# Patient Record
Sex: Male | Born: 1935 | Race: White | Hispanic: No | Marital: Married | Smoking: Former smoker
Health system: Southern US, Community
[De-identification: ages and names within clinical notes are randomized; demographics above are authoritative.]

## PROBLEM LIST (undated history)

## (undated) DIAGNOSIS — I10 Essential (primary) hypertension: Secondary | ICD-10-CM

---

## 2003-12-13 ENCOUNTER — Other Ambulatory Visit: Payer: Self-pay

## 2003-12-19 ENCOUNTER — Other Ambulatory Visit: Payer: Self-pay

## 2004-01-07 ENCOUNTER — Other Ambulatory Visit: Payer: Self-pay

## 2004-01-10 ENCOUNTER — Other Ambulatory Visit: Payer: Self-pay

## 2006-02-10 ENCOUNTER — Ambulatory Visit: Payer: Self-pay | Admitting: Gastroenterology

## 2006-02-21 ENCOUNTER — Ambulatory Visit: Payer: Self-pay | Admitting: Gastroenterology

## 2009-01-25 ENCOUNTER — Inpatient Hospital Stay: Payer: Self-pay | Admitting: Internal Medicine

## 2010-07-05 ENCOUNTER — Encounter: Payer: Self-pay | Admitting: Gastroenterology

## 2011-01-08 NOTE — Letter (Signed)
Summary: New Patient letter  [facility]  [address] [place], [place] [address]   Phone: [phone]  Fax: [phone]       07/05/2010 MRN: [id number]  KINSER FELLMAN [address] [place], [place]  [address]  Dear Mr. Cragun,  Welcome to the Gastroenterology Division at [facility].    You are scheduled to see Dr.  Russella Dar on 09-03-10 at 2:30pm on the 3rd floor at [facility], [address]  We ask that you try to arrive at our office 15 minutes prior to your appointment time to allow for check-in.  We would like you to complete the enclosed self-administered evaluation form prior to your visit and bring it with you on the day of your appointment.  We will review it with you.  Also, please bring a complete list of all your medications or, if you prefer, bring the medication bottles and we will list them.  Please bring your insurance card so that we may make a copy of it.  If your insurance requires a referral to see a specialist, please bring your referral form from your primary care physician.  Co-payments are due at the time of your visit and may be paid by cash, check or credit card.     Your office visit will consist of a consult with your physician (includes a physical exam), any laboratory testing he/she may order, scheduling of any necessary diagnostic testing (e.g. x-ray, ultrasound, CT-scan), and scheduling of a procedure (e.g. Endoscopy, Colonoscopy) if required.  Please allow enough time on your schedule to allow for any/all of these possibilities.    If you cannot keep your appointment, please call [phone] to cancel or reschedule prior to your appointment date.  This allows [place] the opportunity to schedule an appointment for another patient in need of care.  If you do not cancel or reschedule by 5 p.m. the business day prior to your appointment date, you will be charged a $50.00 late cancellation/no-show fee.    Thank you for choosing [facility] for your medical needs.  We appreciate the opportunity to care for you.  Please visit [place] at our website  to learn more about our practice.                     Sincerely,                                                             The Gastroenterology Division

## 2011-08-26 ENCOUNTER — Inpatient Hospital Stay (INDEPENDENT_AMBULATORY_CARE_PROVIDER_SITE_OTHER)
Admission: RE | Admit: 2011-08-26 | Discharge: 2011-08-26 | Disposition: A | Discharge status: Home / Self Care | Payer: Medicare Other | Source: Ambulatory Visit | Attending: Emergency Medicine | Admitting: Emergency Medicine

## 2011-08-26 ENCOUNTER — Emergency Department (HOSPITAL_COMMUNITY)
Admission: EM | Admit: 2011-08-26 | Discharge: 2011-08-26 | Disposition: A | Discharge status: Home / Self Care | Payer: Medicare Other | Attending: Emergency Medicine | Admitting: Emergency Medicine

## 2011-08-26 ENCOUNTER — Emergency Department (HOSPITAL_COMMUNITY): Payer: Medicare Other

## 2011-08-26 DIAGNOSIS — R141 Gas pain: Secondary | ICD-10-CM | POA: Insufficient documentation

## 2011-08-26 DIAGNOSIS — R142 Eructation: Secondary | ICD-10-CM | POA: Insufficient documentation

## 2011-08-26 DIAGNOSIS — I10 Essential (primary) hypertension: Secondary | ICD-10-CM | POA: Insufficient documentation

## 2011-08-26 DIAGNOSIS — K449 Diaphragmatic hernia without obstruction or gangrene: Secondary | ICD-10-CM | POA: Insufficient documentation

## 2011-08-26 DIAGNOSIS — R079 Chest pain, unspecified: Secondary | ICD-10-CM

## 2011-08-26 DIAGNOSIS — R42 Dizziness and giddiness: Secondary | ICD-10-CM | POA: Insufficient documentation

## 2011-08-26 DIAGNOSIS — R1013 Epigastric pain: Secondary | ICD-10-CM | POA: Insufficient documentation

## 2011-08-26 DIAGNOSIS — R110 Nausea: Secondary | ICD-10-CM | POA: Insufficient documentation

## 2011-08-26 LAB — DIFFERENTIAL
Lymphs Abs: 1.2 10*3/uL (ref 0.7–4.0)
Monocytes Absolute: 0.4 10*3/uL (ref 0.1–1.0)
Monocytes Relative: 5 % (ref 3–12)
Neutro Abs: 5.4 10*3/uL (ref 1.7–7.7)
Neutrophils Relative %: 75 % (ref 43–77)

## 2011-08-26 LAB — CBC
Hemoglobin: 13.5 g/dL (ref 13.0–17.0)
MCH: 30.4 pg (ref 26.0–34.0)
MCHC: 34.7 g/dL (ref 30.0–36.0)
MCV: 87.6 fL (ref 78.0–100.0)
RBC: 4.44 MIL/uL (ref 4.22–5.81)

## 2011-08-26 LAB — URINALYSIS, ROUTINE W REFLEX MICROSCOPIC
Bilirubin Urine: NEGATIVE
Glucose, UA: NEGATIVE mg/dL
Hgb urine dipstick: NEGATIVE
Protein, ur: NEGATIVE mg/dL
Specific Gravity, Urine: 1.008 (ref 1.005–1.030)

## 2011-08-26 LAB — COMPREHENSIVE METABOLIC PANEL
ALT: 23 U/L (ref 0–53)
AST: 27 U/L (ref 0–37)
Albumin: 3.8 g/dL (ref 3.5–5.2)
Alkaline Phosphatase: 74 U/L (ref 39–117)
CO2: 26 mEq/L (ref 19–32)
Chloride: 102 mEq/L (ref 96–112)
Potassium: 5.2 mEq/L — ABNORMAL HIGH (ref 3.5–5.1)
Sodium: 137 mEq/L (ref 135–145)
Total Bilirubin: 0.5 mg/dL (ref 0.3–1.2)

## 2011-08-26 LAB — POCT I-STAT TROPONIN I: Troponin i, poc: 0 ng/mL (ref 0.00–0.08)

## 2011-08-26 LAB — CK TOTAL AND CKMB (NOT AT ARMC)
CK, MB: 4.6 ng/mL — ABNORMAL HIGH (ref 0.3–4.0)
Relative Index: INVALID (ref 0.0–2.5)

## 2011-09-13 ENCOUNTER — Ambulatory Visit: Payer: Self-pay | Admitting: Internal Medicine

## 2012-09-04 ENCOUNTER — Telehealth: Payer: Self-pay | Admitting: Gastroenterology

## 2012-09-04 NOTE — Telephone Encounter (Signed)
I spoke with the patient's wife.  She reports that he had a large amount of blood in the toilet'  He filled the toilet with blood.  His wife reports that the patient is not at home and that he was going to his primary care MD's office. They will call back if they need an appt here

## 2013-05-25 ENCOUNTER — Emergency Department: Payer: Self-pay | Admitting: Emergency Medicine

## 2013-05-25 LAB — BASIC METABOLIC PANEL
Anion Gap: 9 (ref 7–16)
Calcium, Total: 8.7 mg/dL (ref 8.5–10.1)
Co2: 21 mmol/L (ref 21–32)
Creatinine: 1.76 mg/dL — ABNORMAL HIGH (ref 0.60–1.30)
Glucose: 95 mg/dL (ref 65–99)
Osmolality: 277 (ref 275–301)
Potassium: 4.7 mmol/L (ref 3.5–5.1)

## 2013-05-25 LAB — CBC
HCT: 39.4 % — ABNORMAL LOW (ref 40.0–52.0)
HGB: 13.3 g/dL (ref 13.0–18.0)
MCHC: 33.8 g/dL (ref 32.0–36.0)
Platelet: 213 10*3/uL (ref 150–440)
RBC: 4.56 10*6/uL (ref 4.40–5.90)
RDW: 14.7 % — ABNORMAL HIGH (ref 11.5–14.5)

## 2013-05-26 LAB — CK TOTAL AND CKMB (NOT AT ARMC): CK-MB: 2.5 ng/mL (ref 0.5–3.6)

## 2014-06-03 LAB — URINALYSIS, COMPLETE
BACTERIA: NONE SEEN
Bacteria: NONE SEEN
Bilirubin,UR: NEGATIVE
Bilirubin,UR: NEGATIVE
Blood: NEGATIVE
GLUCOSE, UR: NEGATIVE mg/dL (ref 0–75)
GLUCOSE, UR: NEGATIVE mg/dL (ref 0–75)
Hyaline Cast: 2
KETONE: NEGATIVE
Ketone: NEGATIVE
Leukocyte Esterase: NEGATIVE
NITRITE: NEGATIVE
NITRITE: NEGATIVE
PH: 5 (ref 4.5–8.0)
Ph: 8 (ref 4.5–8.0)
Protein: NEGATIVE
Protein: 30
RBC,UR: 1 /HPF (ref 0–5)
Specific Gravity: 1.01 (ref 1.003–1.030)
Specific Gravity: 1.019 (ref 1.003–1.030)
Squamous Epithelial: NONE SEEN
WBC UR: 1 /HPF (ref 0–5)
WBC UR: 6 /HPF (ref 0–5)

## 2014-06-03 LAB — COMPREHENSIVE METABOLIC PANEL
ALK PHOS: 209 U/L — AB
ANION GAP: 13 (ref 7–16)
AST: 1555 U/L — AB (ref 15–37)
Albumin: 2.9 g/dL — ABNORMAL LOW (ref 3.4–5.0)
BUN: 25 mg/dL — ABNORMAL HIGH (ref 7–18)
Bilirubin,Total: 2.6 mg/dL — ABNORMAL HIGH (ref 0.2–1.0)
CHLORIDE: 101 mmol/L (ref 98–107)
Calcium, Total: 8.7 mg/dL (ref 8.5–10.1)
Co2: 21 mmol/L (ref 21–32)
Creatinine: 1.67 mg/dL — ABNORMAL HIGH (ref 0.60–1.30)
EGFR (African American): 44 — ABNORMAL LOW
EGFR (Non-African Amer.): 38 — ABNORMAL LOW
GLUCOSE: 91 mg/dL (ref 65–99)
OSMOLALITY: 274 (ref 275–301)
POTASSIUM: 3.6 mmol/L (ref 3.5–5.1)
SGPT (ALT): 482 U/L — ABNORMAL HIGH (ref 12–78)
Sodium: 135 mmol/L — ABNORMAL LOW (ref 136–145)
Total Protein: 6.2 g/dL — ABNORMAL LOW (ref 6.4–8.2)

## 2014-06-03 LAB — CBC
HCT: 38.5 % — AB (ref 40.0–52.0)
HGB: 12.9 g/dL — AB (ref 13.0–18.0)
MCH: 30.5 pg (ref 26.0–34.0)
MCHC: 33.4 g/dL (ref 32.0–36.0)
MCV: 91 fL (ref 80–100)
PLATELETS: 152 10*3/uL (ref 150–440)
RBC: 4.21 10*6/uL — AB (ref 4.40–5.90)
RDW: 13.7 % (ref 11.5–14.5)
WBC: 2.5 10*3/uL — ABNORMAL LOW (ref 3.8–10.6)

## 2014-06-03 LAB — MAGNESIUM
MAGNESIUM: 0.8 mg/dL — AB
MAGNESIUM: 1.6 mg/dL — AB

## 2014-06-03 LAB — CK TOTAL AND CKMB (NOT AT ARMC)
CK, Total: 131 U/L
CK-MB: 1.4 ng/mL (ref 0.5–3.6)

## 2014-06-03 LAB — LIPASE, BLOOD: LIPASE: 735 U/L — AB (ref 73–393)

## 2014-06-03 LAB — BILIRUBIN, DIRECT: BILIRUBIN DIRECT: 1.3 mg/dL — AB (ref 0.00–0.20)

## 2014-06-03 LAB — PHOSPHORUS
PHOSPHORUS: 1.8 mg/dL — AB (ref 2.5–4.9)
Phosphorus: 3 mg/dL (ref 2.5–4.9)

## 2014-06-03 LAB — TROPONIN I

## 2014-06-03 LAB — CLOSTRIDIUM DIFFICILE(ARMC)

## 2014-06-04 ENCOUNTER — Inpatient Hospital Stay: Payer: Self-pay | Admitting: Internal Medicine

## 2014-06-04 LAB — CBC WITH DIFFERENTIAL/PLATELET
BASOS ABS: 0 10*3/uL (ref 0.0–0.1)
Basophil %: 0.2 %
EOS ABS: 0.1 10*3/uL (ref 0.0–0.7)
Eosinophil %: 1.3 %
HCT: 34.4 % — ABNORMAL LOW (ref 40.0–52.0)
HGB: 11.3 g/dL — ABNORMAL LOW (ref 13.0–18.0)
LYMPHS PCT: 5.9 %
Lymphocyte #: 0.7 10*3/uL — ABNORMAL LOW (ref 1.0–3.6)
MCH: 30.4 pg (ref 26.0–34.0)
MCHC: 33 g/dL (ref 32.0–36.0)
MCV: 92 fL (ref 80–100)
MONO ABS: 0.4 x10 3/mm (ref 0.2–1.0)
Monocyte %: 3.3 %
Neutrophil #: 9.9 10*3/uL — ABNORMAL HIGH (ref 1.4–6.5)
Neutrophil %: 89.3 %
Platelet: 126 10*3/uL — ABNORMAL LOW (ref 150–440)
RBC: 3.73 10*6/uL — AB (ref 4.40–5.90)
RDW: 13.9 % (ref 11.5–14.5)
WBC: 11 10*3/uL — ABNORMAL HIGH (ref 3.8–10.6)

## 2014-06-04 LAB — COMPREHENSIVE METABOLIC PANEL
ALBUMIN: 2.5 g/dL — AB (ref 3.4–5.0)
AST: 712 U/L — AB (ref 15–37)
Alkaline Phosphatase: 114 U/L
Anion Gap: 12 (ref 7–16)
BUN: 25 mg/dL — ABNORMAL HIGH (ref 7–18)
Bilirubin,Total: 3.1 mg/dL — ABNORMAL HIGH (ref 0.2–1.0)
CHLORIDE: 100 mmol/L (ref 98–107)
Calcium, Total: 7.5 mg/dL — ABNORMAL LOW (ref 8.5–10.1)
Co2: 21 mmol/L (ref 21–32)
Creatinine: 2.27 mg/dL — ABNORMAL HIGH (ref 0.60–1.30)
EGFR (African American): 30 — ABNORMAL LOW
EGFR (Non-African Amer.): 26 — ABNORMAL LOW
Glucose: 73 mg/dL (ref 65–99)
Osmolality: 269 (ref 275–301)
POTASSIUM: 3.8 mmol/L (ref 3.5–5.1)
SGPT (ALT): 396 U/L — ABNORMAL HIGH (ref 12–78)
Sodium: 133 mmol/L — ABNORMAL LOW (ref 136–145)
Total Protein: 5.5 g/dL — ABNORMAL LOW (ref 6.4–8.2)

## 2014-06-04 LAB — LIPID PANEL
CHOLESTEROL: 50 mg/dL (ref 0–200)
HDL Cholesterol: 33 mg/dL — ABNORMAL LOW (ref 40–60)
LDL CHOLESTEROL, CALC: 4 mg/dL (ref 0–100)
Triglycerides: 63 mg/dL (ref 0–200)
VLDL Cholesterol, Calc: 13 mg/dL (ref 5–40)

## 2014-06-04 LAB — MAGNESIUM: Magnesium: 1.4 mg/dL — ABNORMAL LOW

## 2014-06-04 LAB — TSH: THYROID STIMULATING HORM: 2.14 u[IU]/mL

## 2014-06-05 DIAGNOSIS — I059 Rheumatic mitral valve disease, unspecified: Secondary | ICD-10-CM

## 2014-06-05 LAB — COMPREHENSIVE METABOLIC PANEL
Albumin: 2.6 g/dL — ABNORMAL LOW (ref 3.4–5.0)
Alkaline Phosphatase: 133 U/L — ABNORMAL HIGH
Anion Gap: 9 (ref 7–16)
BUN: 20 mg/dL — ABNORMAL HIGH (ref 7–18)
Bilirubin,Total: 1.4 mg/dL — ABNORMAL HIGH (ref 0.2–1.0)
Calcium, Total: 8 mg/dL — ABNORMAL LOW (ref 8.5–10.1)
Chloride: 101 mmol/L (ref 98–107)
Co2: 22 mmol/L (ref 21–32)
Creatinine: 1.97 mg/dL — ABNORMAL HIGH (ref 0.60–1.30)
EGFR (African American): 36 — ABNORMAL LOW
EGFR (Non-African Amer.): 31 — ABNORMAL LOW
Glucose: 90 mg/dL (ref 65–99)
Osmolality: 267 (ref 275–301)
Potassium: 3.4 mmol/L — ABNORMAL LOW (ref 3.5–5.1)
SGOT(AST): 288 U/L — ABNORMAL HIGH (ref 15–37)
SGPT (ALT): 272 U/L — ABNORMAL HIGH (ref 12–78)
Sodium: 132 mmol/L — ABNORMAL LOW (ref 136–145)
Total Protein: 6.2 g/dL — ABNORMAL LOW (ref 6.4–8.2)

## 2014-06-05 LAB — URINE CULTURE

## 2014-06-06 LAB — STOOL CULTURE

## 2014-06-07 LAB — CBC WITH DIFFERENTIAL/PLATELET
Basophil #: 0.1 10*3/uL (ref 0.0–0.1)
Basophil %: 0.6 %
EOS ABS: 0.1 10*3/uL (ref 0.0–0.7)
Eosinophil %: 1.6 %
HCT: 37.3 % — ABNORMAL LOW (ref 40.0–52.0)
HGB: 12.3 g/dL — AB (ref 13.0–18.0)
LYMPHS PCT: 9.7 %
Lymphocyte #: 0.9 10*3/uL — ABNORMAL LOW (ref 1.0–3.6)
MCH: 29.8 pg (ref 26.0–34.0)
MCHC: 32.9 g/dL (ref 32.0–36.0)
MCV: 91 fL (ref 80–100)
Monocyte #: 1 x10 3/mm (ref 0.2–1.0)
Monocyte %: 11.3 %
NEUTROS ABS: 6.8 10*3/uL — AB (ref 1.4–6.5)
Neutrophil %: 76.8 %
Platelet: 152 10*3/uL (ref 150–440)
RBC: 4.11 10*6/uL — AB (ref 4.40–5.90)
RDW: 14.4 % (ref 11.5–14.5)
WBC: 8.9 10*3/uL (ref 3.8–10.6)

## 2014-06-07 LAB — BASIC METABOLIC PANEL
Anion Gap: 11 (ref 7–16)
BUN: 16 mg/dL (ref 7–18)
Calcium, Total: 8.4 mg/dL — ABNORMAL LOW (ref 8.5–10.1)
Chloride: 104 mmol/L (ref 98–107)
Co2: 20 mmol/L — ABNORMAL LOW (ref 21–32)
Creatinine: 1.55 mg/dL — ABNORMAL HIGH (ref 0.60–1.30)
EGFR (African American): 48 — ABNORMAL LOW
EGFR (Non-African Amer.): 41 — ABNORMAL LOW
Glucose: 100 mg/dL — ABNORMAL HIGH (ref 65–99)
Osmolality: 271 (ref 275–301)
Potassium: 3.3 mmol/L — ABNORMAL LOW (ref 3.5–5.1)
SODIUM: 135 mmol/L — AB (ref 136–145)

## 2014-06-08 LAB — CULTURE, BLOOD (SINGLE)

## 2014-06-11 LAB — CULTURE, BLOOD (SINGLE)

## 2014-08-07 ENCOUNTER — Emergency Department: Payer: Self-pay | Admitting: Emergency Medicine

## 2014-08-07 LAB — URINALYSIS, COMPLETE
BILIRUBIN, UR: NEGATIVE
Blood: NEGATIVE
Glucose,UR: NEGATIVE mg/dL (ref 0–75)
Hyaline Cast: 3
Ketone: NEGATIVE
LEUKOCYTE ESTERASE: NEGATIVE
NITRITE: NEGATIVE
PH: 5 (ref 4.5–8.0)
PROTEIN: NEGATIVE
SQUAMOUS EPITHELIAL: NONE SEEN
Specific Gravity: 1.015 (ref 1.003–1.030)

## 2014-08-07 LAB — CBC
HCT: 38.7 % — AB (ref 40.0–52.0)
HGB: 12.9 g/dL — ABNORMAL LOW (ref 13.0–18.0)
MCH: 30.8 pg (ref 26.0–34.0)
MCHC: 33.4 g/dL (ref 32.0–36.0)
MCV: 92 fL (ref 80–100)
Platelet: 249 10*3/uL (ref 150–440)
RBC: 4.2 10*6/uL — ABNORMAL LOW (ref 4.40–5.90)
RDW: 14.5 % (ref 11.5–14.5)
WBC: 8.7 10*3/uL (ref 3.8–10.6)

## 2014-08-07 LAB — TROPONIN I: Troponin-I: <0.02 ng/mL

## 2014-08-07 LAB — COMPREHENSIVE METABOLIC PANEL
ALT: 29 U/L
ANION GAP: 9 (ref 7–16)
Albumin: 3 g/dL — ABNORMAL LOW (ref 3.4–5.0)
Alkaline Phosphatase: 89 U/L
BILIRUBIN TOTAL: 0.7 mg/dL (ref 0.2–1.0)
BUN: 25 mg/dL — AB (ref 7–18)
CALCIUM: 8.6 mg/dL (ref 8.5–10.1)
Chloride: 96 mmol/L — ABNORMAL LOW (ref 98–107)
Co2: 24 mmol/L (ref 21–32)
Creatinine: 1.58 mg/dL — ABNORMAL HIGH (ref 0.60–1.30)
EGFR (Non-African Amer.): 40 — ABNORMAL LOW
GFR CALC AF AMER: 47 — AB
GLUCOSE: 98 mg/dL (ref 65–99)
Osmolality: 263 (ref 275–301)
Potassium: 4 mmol/L (ref 3.5–5.1)
SGOT(AST): 37 U/L (ref 15–37)
Sodium: 129 mmol/L — ABNORMAL LOW (ref 136–145)
TOTAL PROTEIN: 6.9 g/dL (ref 6.4–8.2)

## 2014-08-12 ENCOUNTER — Inpatient Hospital Stay: Payer: Self-pay | Admitting: Internal Medicine

## 2014-08-13 LAB — COMPREHENSIVE METABOLIC PANEL
Albumin: 2.5 g/dL — ABNORMAL LOW (ref 3.4–5.0)
Alkaline Phosphatase: 78 U/L
Anion Gap: 11 (ref 7–16)
BILIRUBIN TOTAL: 0.3 mg/dL (ref 0.2–1.0)
BUN: 35 mg/dL — ABNORMAL HIGH (ref 7–18)
CALCIUM: 7.5 mg/dL — AB (ref 8.5–10.1)
Chloride: 105 mmol/L (ref 98–107)
Co2: 19 mmol/L — ABNORMAL LOW (ref 21–32)
Creatinine: 2.39 mg/dL — ABNORMAL HIGH (ref 0.60–1.30)
EGFR (African American): 28 — ABNORMAL LOW
GFR CALC NON AF AMER: 24 — AB
GLUCOSE: 98 mg/dL (ref 65–99)
Osmolality: 278 (ref 275–301)
Potassium: 3.6 mmol/L (ref 3.5–5.1)
SGOT(AST): 14 U/L — ABNORMAL LOW (ref 15–37)
SGPT (ALT): 17 U/L
Sodium: 135 mmol/L — ABNORMAL LOW (ref 136–145)
Total Protein: 5.7 g/dL — ABNORMAL LOW (ref 6.4–8.2)

## 2014-08-13 LAB — CBC WITH DIFFERENTIAL/PLATELET
BASOS PCT: 0.9 %
Basophil #: 0 10*3/uL (ref 0.0–0.1)
EOS PCT: 7.4 %
Eosinophil #: 0.4 10*3/uL (ref 0.0–0.7)
HCT: 33.1 % — AB (ref 40.0–52.0)
HGB: 11 g/dL — AB (ref 13.0–18.0)
Lymphocyte #: 1.3 10*3/uL (ref 1.0–3.6)
Lymphocyte %: 24.3 %
MCH: 30.5 pg (ref 26.0–34.0)
MCHC: 33.1 g/dL (ref 32.0–36.0)
MCV: 92 fL (ref 80–100)
Monocyte #: 0.6 x10 3/mm (ref 0.2–1.0)
Monocyte %: 10.5 %
NEUTROS ABS: 3.1 10*3/uL (ref 1.4–6.5)
NEUTROS PCT: 56.9 %
Platelet: 284 10*3/uL (ref 150–440)
RBC: 3.6 10*6/uL — AB (ref 4.40–5.90)
RDW: 14.9 % — ABNORMAL HIGH (ref 11.5–14.5)
WBC: 5.4 10*3/uL (ref 3.8–10.6)

## 2014-08-13 LAB — PROTEIN / CREATININE RATIO, URINE
CREATININE, URINE: 45 mg/dL (ref 30.0–125.0)
PROTEIN/CREAT. RATIO: 133 mg/g{creat} (ref 0–200)
Protein, Random Urine: 6 mg/dL (ref 0–12)

## 2014-08-13 LAB — SODIUM, URINE, RANDOM: SODIUM, URINE RANDOM: 13 mmol/L (ref 20–110)

## 2014-08-13 LAB — PHOSPHORUS: Phosphorus: 3 mg/dL (ref 2.5–4.9)

## 2014-08-14 LAB — COMPREHENSIVE METABOLIC PANEL
ALBUMIN: 2.3 g/dL — AB (ref 3.4–5.0)
ALK PHOS: 82 U/L
Anion Gap: 8 (ref 7–16)
BILIRUBIN TOTAL: 0.3 mg/dL (ref 0.2–1.0)
BUN: 29 mg/dL — AB (ref 7–18)
CHLORIDE: 109 mmol/L — AB (ref 98–107)
Calcium, Total: 6.6 mg/dL — CL (ref 8.5–10.1)
Co2: 21 mmol/L (ref 21–32)
Creatinine: 2.01 mg/dL — ABNORMAL HIGH (ref 0.60–1.30)
GFR CALC AF AMER: 35 — AB
GFR CALC NON AF AMER: 30 — AB
Glucose: 85 mg/dL (ref 65–99)
OSMOLALITY: 281 (ref 275–301)
Potassium: 3.9 mmol/L (ref 3.5–5.1)
SGOT(AST): 26 U/L (ref 15–37)
SGPT (ALT): 21 U/L
SODIUM: 138 mmol/L (ref 136–145)
Total Protein: 5.3 g/dL — ABNORMAL LOW (ref 6.4–8.2)

## 2014-08-14 LAB — URINE CULTURE

## 2014-08-15 LAB — CBC WITH DIFFERENTIAL/PLATELET
Basophil #: 0.1 10*3/uL (ref 0.0–0.1)
Basophil %: 1.1 %
EOS PCT: 7.2 %
Eosinophil #: 0.3 10*3/uL (ref 0.0–0.7)
HCT: 30.9 % — ABNORMAL LOW (ref 40.0–52.0)
HGB: 10.2 g/dL — AB (ref 13.0–18.0)
LYMPHS PCT: 26.5 %
Lymphocyte #: 1.3 10*3/uL (ref 1.0–3.6)
MCH: 30.7 pg (ref 26.0–34.0)
MCHC: 33.2 g/dL (ref 32.0–36.0)
MCV: 93 fL (ref 80–100)
MONO ABS: 0.4 x10 3/mm (ref 0.2–1.0)
MONOS PCT: 8.8 %
NEUTROS ABS: 2.7 10*3/uL (ref 1.4–6.5)
NEUTROS PCT: 56.4 %
PLATELETS: 273 10*3/uL (ref 150–440)
RBC: 3.33 10*6/uL — ABNORMAL LOW (ref 4.40–5.90)
RDW: 14.6 % — AB (ref 11.5–14.5)
WBC: 4.7 10*3/uL (ref 3.8–10.6)

## 2014-08-15 LAB — COMPREHENSIVE METABOLIC PANEL
ALBUMIN: 2.3 g/dL — AB (ref 3.4–5.0)
ALK PHOS: 77 U/L
ANION GAP: 7 (ref 7–16)
AST: 19 U/L (ref 15–37)
BUN: 21 mg/dL — ABNORMAL HIGH (ref 7–18)
Bilirubin,Total: 0.3 mg/dL (ref 0.2–1.0)
CO2: 21 mmol/L (ref 21–32)
CREATININE: 1.67 mg/dL — AB (ref 0.60–1.30)
Calcium, Total: 7.2 mg/dL — ABNORMAL LOW (ref 8.5–10.1)
Chloride: 110 mmol/L — ABNORMAL HIGH (ref 98–107)
EGFR (African American): 44 — ABNORMAL LOW
GFR CALC NON AF AMER: 38 — AB
Glucose: 93 mg/dL (ref 65–99)
OSMOLALITY: 278 (ref 275–301)
Potassium: 4 mmol/L (ref 3.5–5.1)
SGPT (ALT): 20 U/L
SODIUM: 138 mmol/L (ref 136–145)
TOTAL PROTEIN: 5.2 g/dL — AB (ref 6.4–8.2)

## 2014-08-17 LAB — PROTEIN ELECTROPHORESIS(ARMC)

## 2014-08-17 LAB — CULTURE, BLOOD (SINGLE)

## 2014-08-18 LAB — UR PROT ELECTROPHORESIS, URINE RANDOM

## 2014-11-09 ENCOUNTER — Ambulatory Visit: Payer: Self-pay | Admitting: Gastroenterology

## 2014-11-16 ENCOUNTER — Ambulatory Visit: Payer: Self-pay | Admitting: Gastroenterology

## 2015-01-04 ENCOUNTER — Inpatient Hospital Stay: Payer: Self-pay

## 2015-01-04 LAB — CBC
HCT: 33.1 % — ABNORMAL LOW (ref 40.0–52.0)
HGB: 11 g/dL — AB (ref 13.0–18.0)
MCH: 31.1 pg (ref 26.0–34.0)
MCHC: 33.2 g/dL (ref 32.0–36.0)
MCV: 94 fL (ref 80–100)
Platelet: 229 10*3/uL (ref 150–440)
RBC: 3.53 10*6/uL — AB (ref 4.40–5.90)
RDW: 14.9 % — ABNORMAL HIGH (ref 11.5–14.5)
WBC: 16.9 10*3/uL — ABNORMAL HIGH (ref 3.8–10.6)

## 2015-01-04 LAB — URINALYSIS, COMPLETE
Blood: NEGATIVE
Glucose,UR: NEGATIVE mg/dL (ref 0–75)
KETONE: NEGATIVE
Leukocyte Esterase: NEGATIVE
Nitrite: NEGATIVE
PH: 5 (ref 4.5–8.0)
Protein: NEGATIVE
RBC,UR: <1 /HPF (ref 0–5)
SPECIFIC GRAVITY: 1.023 (ref 1.003–1.030)
Squamous Epithelial: <1

## 2015-01-04 LAB — COMPREHENSIVE METABOLIC PANEL
ALT: 61 U/L (ref 14–63)
AST: 119 U/L — AB (ref 15–37)
Albumin: 1.7 g/dL — ABNORMAL LOW (ref 3.4–5.0)
Alkaline Phosphatase: 231 U/L — ABNORMAL HIGH (ref 46–116)
Anion Gap: 12 (ref 7–16)
BUN: 21 mg/dL — ABNORMAL HIGH (ref 7–18)
Bilirubin,Total: 1.7 mg/dL — ABNORMAL HIGH (ref 0.2–1.0)
CALCIUM: 7.9 mg/dL — AB (ref 8.5–10.1)
CHLORIDE: 98 mmol/L (ref 98–107)
CO2: 22 mmol/L (ref 21–32)
Creatinine: 1.71 mg/dL — ABNORMAL HIGH (ref 0.60–1.30)
EGFR (Non-African Amer.): 41 — ABNORMAL LOW
GFR CALC AF AMER: 50 — AB
Glucose: 95 mg/dL (ref 65–99)
Osmolality: 267 (ref 275–301)
Potassium: 4 mmol/L (ref 3.5–5.1)
Sodium: 132 mmol/L — ABNORMAL LOW (ref 136–145)
Total Protein: 5.7 g/dL — ABNORMAL LOW (ref 6.4–8.2)

## 2015-01-04 LAB — PHOSPHORUS: Phosphorus: 2.5 mg/dL (ref 2.5–4.9)

## 2015-01-04 LAB — APTT: Activated PTT: 30.4 secs (ref 23.6–35.9)

## 2015-01-04 LAB — CK TOTAL AND CKMB (NOT AT ARMC): CK, TOTAL: 18 U/L — AB (ref 39–308)

## 2015-01-04 LAB — SEDIMENTATION RATE: Erythrocyte Sed Rate: 28 mm/hr — ABNORMAL HIGH (ref 0–20)

## 2015-01-04 LAB — TROPONIN I: Troponin-I: 0.03 ng/mL

## 2015-01-04 LAB — PROTIME-INR
INR: 1.1
PROTHROMBIN TIME: 14.3 s (ref 11.5–14.7)

## 2015-01-04 LAB — MAGNESIUM: Magnesium: 1.5 mg/dL — ABNORMAL LOW

## 2015-01-05 LAB — CBC WITH DIFFERENTIAL/PLATELET
Basophil #: 0 10*3/uL (ref 0.0–0.1)
Basophil %: 0.4 %
EOS PCT: 0.4 %
Eosinophil #: 0 10*3/uL (ref 0.0–0.7)
HCT: 25.4 % — ABNORMAL LOW (ref 40.0–52.0)
HGB: 8.4 g/dL — ABNORMAL LOW (ref 13.0–18.0)
LYMPHS ABS: 1.3 10*3/uL (ref 1.0–3.6)
Lymphocyte %: 12.4 %
MCH: 31.7 pg (ref 26.0–34.0)
MCHC: 33.2 g/dL (ref 32.0–36.0)
MCV: 95 fL (ref 80–100)
MONO ABS: 0.6 x10 3/mm (ref 0.2–1.0)
Monocyte %: 5.8 %
NEUTROS ABS: 8.6 10*3/uL — AB (ref 1.4–6.5)
Neutrophil %: 81 %
PLATELETS: 155 10*3/uL (ref 150–440)
RBC: 2.67 10*6/uL — AB (ref 4.40–5.90)
RDW: 15.2 % — ABNORMAL HIGH (ref 11.5–14.5)
WBC: 10.6 10*3/uL (ref 3.8–10.6)

## 2015-01-05 LAB — COMPREHENSIVE METABOLIC PANEL
ALK PHOS: 158 U/L — AB (ref 46–116)
ANION GAP: 10 (ref 7–16)
Albumin: 1.2 g/dL — ABNORMAL LOW (ref 3.4–5.0)
BUN: 20 mg/dL — ABNORMAL HIGH (ref 7–18)
Bilirubin,Total: 1.1 mg/dL — ABNORMAL HIGH (ref 0.2–1.0)
CO2: 24 mmol/L (ref 21–32)
Calcium, Total: 6.6 mg/dL — CL (ref 8.5–10.1)
Chloride: 101 mmol/L (ref 98–107)
Creatinine: 1.75 mg/dL — ABNORMAL HIGH (ref 0.60–1.30)
EGFR (African American): 48 — ABNORMAL LOW
GFR CALC NON AF AMER: 40 — AB
GLUCOSE: 114 mg/dL — AB (ref 65–99)
OSMOLALITY: 274 (ref 275–301)
POTASSIUM: 3.3 mmol/L — AB (ref 3.5–5.1)
SGOT(AST): 71 U/L — ABNORMAL HIGH (ref 15–37)
SGPT (ALT): 42 U/L (ref 14–63)
SODIUM: 135 mmol/L — AB (ref 136–145)
Total Protein: 4.3 g/dL — ABNORMAL LOW (ref 6.4–8.2)

## 2015-01-05 LAB — CALCIUM: CALCIUM: 6.6 mg/dL — AB (ref 8.5–10.1)

## 2015-01-05 LAB — TSH: Thyroid Stimulating Horm: 4.26 u[IU]/mL

## 2015-01-06 LAB — CBC WITH DIFFERENTIAL/PLATELET
Basophil #: 0 10*3/uL (ref 0.0–0.1)
Basophil %: 0.5 %
EOS PCT: 1.3 %
Eosinophil #: 0.1 10*3/uL (ref 0.0–0.7)
HCT: 25.7 % — AB (ref 40.0–52.0)
HGB: 8.7 g/dL — ABNORMAL LOW (ref 13.0–18.0)
LYMPHS ABS: 1.3 10*3/uL (ref 1.0–3.6)
Lymphocyte %: 15.3 %
MCH: 31.8 pg (ref 26.0–34.0)
MCHC: 33.8 g/dL (ref 32.0–36.0)
MCV: 94 fL (ref 80–100)
MONOS PCT: 5.6 %
Monocyte #: 0.5 x10 3/mm (ref 0.2–1.0)
NEUTROS ABS: 6.8 10*3/uL — AB (ref 1.4–6.5)
Neutrophil %: 77.3 %
Platelet: 157 10*3/uL (ref 150–440)
RBC: 2.73 10*6/uL — AB (ref 4.40–5.90)
RDW: 15 % — ABNORMAL HIGH (ref 11.5–14.5)
WBC: 8.8 10*3/uL (ref 3.8–10.6)

## 2015-01-06 LAB — BASIC METABOLIC PANEL
Anion Gap: 8 (ref 7–16)
BUN: 19 mg/dL — ABNORMAL HIGH (ref 7–18)
CALCIUM: 6.6 mg/dL — AB (ref 8.5–10.1)
CHLORIDE: 106 mmol/L (ref 98–107)
CO2: 23 mmol/L (ref 21–32)
Creatinine: 1.56 mg/dL — ABNORMAL HIGH (ref 0.60–1.30)
EGFR (Non-African Amer.): 46 — ABNORMAL LOW
GFR CALC AF AMER: 55 — AB
Glucose: 90 mg/dL (ref 65–99)
Osmolality: 276 (ref 275–301)
Potassium: 3.2 mmol/L — ABNORMAL LOW (ref 3.5–5.1)
SODIUM: 137 mmol/L (ref 136–145)

## 2015-01-06 LAB — PSA: PSA: 0.2 ng/mL (ref 0.0–4.0)

## 2015-01-06 LAB — CLOSTRIDIUM DIFFICILE(ARMC)

## 2015-01-06 LAB — URINE CULTURE

## 2015-01-07 LAB — CBC WITH DIFFERENTIAL/PLATELET
Basophil #: 0 10*3/uL (ref 0.0–0.1)
Basophil %: 0.7 %
Eosinophil #: 0.2 10*3/uL (ref 0.0–0.7)
Eosinophil %: 2.4 %
HCT: 24.9 % — AB (ref 40.0–52.0)
HGB: 8.2 g/dL — ABNORMAL LOW (ref 13.0–18.0)
Lymphocyte #: 1 10*3/uL (ref 1.0–3.6)
Lymphocyte %: 16.3 %
MCH: 31.4 pg (ref 26.0–34.0)
MCHC: 33 g/dL (ref 32.0–36.0)
MCV: 95 fL (ref 80–100)
MONO ABS: 0.4 x10 3/mm (ref 0.2–1.0)
Monocyte %: 6.1 %
NEUTROS ABS: 4.8 10*3/uL (ref 1.4–6.5)
NEUTROS PCT: 74.5 %
Platelet: 158 10*3/uL (ref 150–440)
RBC: 2.62 10*6/uL — ABNORMAL LOW (ref 4.40–5.90)
RDW: 15.4 % — AB (ref 11.5–14.5)
WBC: 6.4 10*3/uL (ref 3.8–10.6)

## 2015-01-07 LAB — BASIC METABOLIC PANEL
Anion Gap: 7 (ref 7–16)
BUN: 16 mg/dL (ref 7–18)
Calcium, Total: 6.4 mg/dL — CL (ref 8.5–10.1)
Chloride: 109 mmol/L — ABNORMAL HIGH (ref 98–107)
Co2: 23 mmol/L (ref 21–32)
Creatinine: 1.47 mg/dL — ABNORMAL HIGH (ref 0.60–1.30)
EGFR (African American): 59 — ABNORMAL LOW
GFR CALC NON AF AMER: 49 — AB
Glucose: 112 mg/dL — ABNORMAL HIGH (ref 65–99)
OSMOLALITY: 279 (ref 275–301)
Potassium: 3.2 mmol/L — ABNORMAL LOW (ref 3.5–5.1)
Sodium: 139 mmol/L (ref 136–145)

## 2015-01-07 LAB — VANCOMYCIN, TROUGH: VANCOMYCIN, TROUGH: 15 ug/mL (ref 10–20)

## 2015-01-08 LAB — COMPREHENSIVE METABOLIC PANEL
ALBUMIN: 1.2 g/dL — AB (ref 3.4–5.0)
ALT: 37 U/L (ref 14–63)
Alkaline Phosphatase: 148 U/L — ABNORMAL HIGH (ref 46–116)
Anion Gap: 7 (ref 7–16)
BUN: 14 mg/dL (ref 7–18)
Bilirubin,Total: 0.8 mg/dL (ref 0.2–1.0)
CREATININE: 1.39 mg/dL — AB (ref 0.60–1.30)
Calcium, Total: 7.1 mg/dL — ABNORMAL LOW (ref 8.5–10.1)
Chloride: 112 mmol/L — ABNORMAL HIGH (ref 98–107)
Co2: 21 mmol/L (ref 21–32)
EGFR (African American): >60
EGFR (Non-African Amer.): 52 — ABNORMAL LOW
GLUCOSE: 82 mg/dL (ref 65–99)
OSMOLALITY: 279 (ref 275–301)
Potassium: 4 mmol/L (ref 3.5–5.1)
SGOT(AST): 57 U/L — ABNORMAL HIGH (ref 15–37)
SODIUM: 140 mmol/L (ref 136–145)
Total Protein: 4.8 g/dL — ABNORMAL LOW (ref 6.4–8.2)

## 2015-01-08 LAB — CBC WITH DIFFERENTIAL/PLATELET
BASOS ABS: 0.1 10*3/uL (ref 0.0–0.1)
Basophil %: 1 %
Eosinophil #: 0.2 10*3/uL (ref 0.0–0.7)
Eosinophil %: 2.2 %
HCT: 27.3 % — ABNORMAL LOW (ref 40.0–52.0)
HGB: 8.9 g/dL — ABNORMAL LOW (ref 13.0–18.0)
LYMPHS ABS: 1.4 10*3/uL (ref 1.0–3.6)
LYMPHS PCT: 19 %
MCH: 31.2 pg (ref 26.0–34.0)
MCHC: 32.7 g/dL (ref 32.0–36.0)
MCV: 96 fL (ref 80–100)
MONOS PCT: 8 %
Monocyte #: 0.6 x10 3/mm (ref 0.2–1.0)
Neutrophil #: 5.1 10*3/uL (ref 1.4–6.5)
Neutrophil %: 69.8 %
PLATELETS: 184 10*3/uL (ref 150–440)
RBC: 2.85 10*6/uL — AB (ref 4.40–5.90)
RDW: 15.4 % — ABNORMAL HIGH (ref 11.5–14.5)
WBC: 7.3 10*3/uL (ref 3.8–10.6)

## 2015-01-09 ENCOUNTER — Ambulatory Visit: Payer: Self-pay | Admitting: Family Medicine

## 2015-01-09 LAB — CBC WITH DIFFERENTIAL/PLATELET
Basophil #: 0 10*3/uL (ref 0.0–0.1)
Basophil %: 0.4 %
EOS ABS: 0.2 10*3/uL (ref 0.0–0.7)
EOS PCT: 2.4 %
HCT: 24.8 % — ABNORMAL LOW (ref 40.0–52.0)
HGB: 8.1 g/dL — ABNORMAL LOW (ref 13.0–18.0)
Lymphocyte #: 1.3 10*3/uL (ref 1.0–3.6)
Lymphocyte %: 17.8 %
MCH: 31.4 pg (ref 26.0–34.0)
MCHC: 32.4 g/dL (ref 32.0–36.0)
MCV: 97 fL (ref 80–100)
Monocyte #: 0.5 x10 3/mm (ref 0.2–1.0)
Monocyte %: 7.3 %
NEUTROS PCT: 72.1 %
Neutrophil #: 5.1 10*3/uL (ref 1.4–6.5)
PLATELETS: 172 10*3/uL (ref 150–440)
RBC: 2.57 10*6/uL — ABNORMAL LOW (ref 4.40–5.90)
RDW: 15.6 % — ABNORMAL HIGH (ref 11.5–14.5)
WBC: 7.1 10*3/uL (ref 3.8–10.6)

## 2015-01-09 LAB — CREATININE, SERUM
Creatinine: 1.38 mg/dL — ABNORMAL HIGH (ref 0.60–1.30)
EGFR (African American): >60
EGFR (Non-African Amer.): 52 — ABNORMAL LOW

## 2015-01-09 LAB — CULTURE, BLOOD (SINGLE)

## 2015-01-10 LAB — CBC WITH DIFFERENTIAL/PLATELET
Basophil #: 0.1 10*3/uL (ref 0.0–0.1)
Basophil %: 0.9 %
EOS PCT: 2.2 %
Eosinophil #: 0.2 10*3/uL (ref 0.0–0.7)
HCT: 26.5 % — ABNORMAL LOW (ref 40.0–52.0)
HGB: 8.9 g/dL — ABNORMAL LOW (ref 13.0–18.0)
Lymphocyte #: 1.1 10*3/uL (ref 1.0–3.6)
Lymphocyte %: 14.5 %
MCH: 32.2 pg (ref 26.0–34.0)
MCHC: 33.5 g/dL (ref 32.0–36.0)
MCV: 96 fL (ref 80–100)
Monocyte #: 0.5 x10 3/mm (ref 0.2–1.0)
Monocyte %: 6.9 %
NEUTROS ABS: 5.5 10*3/uL (ref 1.4–6.5)
NEUTROS PCT: 75.5 %
Platelet: 202 10*3/uL (ref 150–440)
RBC: 2.76 10*6/uL — AB (ref 4.40–5.90)
RDW: 15.5 % — ABNORMAL HIGH (ref 11.5–14.5)
WBC: 7.3 10*3/uL (ref 3.8–10.6)

## 2015-01-10 LAB — FERRITIN: Ferritin (ARMC): 571 ng/mL — ABNORMAL HIGH (ref 8–388)

## 2015-01-11 LAB — CULTURE, BLOOD (SINGLE)

## 2015-01-12 DIAGNOSIS — E43 Unspecified severe protein-calorie malnutrition: Secondary | ICD-10-CM

## 2015-01-12 DIAGNOSIS — F0390 Unspecified dementia without behavioral disturbance: Secondary | ICD-10-CM

## 2015-01-12 DIAGNOSIS — N400 Enlarged prostate without lower urinary tract symptoms: Secondary | ICD-10-CM

## 2015-01-12 DIAGNOSIS — A4189 Other specified sepsis: Secondary | ICD-10-CM

## 2015-01-12 DIAGNOSIS — I10 Essential (primary) hypertension: Secondary | ICD-10-CM

## 2015-01-17 DIAGNOSIS — L03119 Cellulitis of unspecified part of limb: Secondary | ICD-10-CM

## 2015-01-18 ENCOUNTER — Inpatient Hospital Stay: Payer: Self-pay | Admitting: Infectious Diseases

## 2015-01-19 ENCOUNTER — Telehealth: Payer: Self-pay

## 2015-01-19 NOTE — Telephone Encounter (Signed)
Was sent to [facility] and has been admitted

## 2015-01-19 NOTE — Telephone Encounter (Signed)
PLEASE NOTE: All timestamps contained within this report are represented as [place] Standard Time. CONFIDENTIALTY NOTICE: This fax transmission is intended only for the addressee. It contains information that is legally privileged, confidential or otherwise protected from use or disclosure. If you are not the intended recipient, you are strictly prohibited from reviewing, disclosing, copying using or disseminating any of this information or taking any action in reliance on or regarding this information. If you have received this fax in error, please notify us immediately by telephone so that we can arrange for its return to us. Phone: [phone], Toll-Free: [phone], Fax: [phone] Page: 1 of 1 Call Id: [id number] [facility] - Client TELEPHONE ADVICE RECORD [facility] Patient Name: Marcus Gates Gender: Male DOB: 09-08-1930 Age: [age] Y 11 M 16 D Return Phone Number: Address: City/State/Zip: [place] Stat[facility] Night Physician Tillman Abide[place], Richard Contact Type Call Call Type Page Only Caller Name Allen Kellnn Hummel [phone] Is this call to report lab results? No Return Phone Number Unavailable Initial Comment Caller states she is Allen Kellnn Hummel from [facility]. PT is c/o chest pain, blood pressure is 110/50 and 140/70. Requesting consult on medication. Nurse Assessment Guidelines Guideline Title Affirmed Question Affirmed Notes Nurse Date/Time (Eastern Time) Disp. Time Lamount Cohen(Eastern Time) Disposition Final User 01/18/2015 9:37:00 PM [facility] Paging Queue Rayder[facility] 01/18/2015 9:47:24 PM Called On-Call Provider Nelida GoresHaynes, Erika 01/18/2015 9:48:01 PM Page Completed Yes Nelida GoresHaynes, Erika After Care Instructions Given Call Event Type User Date / Time Description Paging DoctorName DoctorPhone DateTime Result/Outcome Notes Oliver BarreJohn, James [phone] 01/18/2015  9:47:24 PM Called On Call Provider - Reached Oliver BarreJohn, James 01/18/2015 9:47:49 PM Paged On Call to Another Provider Page infromation provided to on call. On call was connected with facility.

## 2015-01-20 ENCOUNTER — Other Ambulatory Visit: Payer: Self-pay | Admitting: Physician Assistant

## 2015-01-20 DIAGNOSIS — I4891 Unspecified atrial fibrillation: Secondary | ICD-10-CM

## 2015-01-20 DIAGNOSIS — A412 Sepsis due to unspecified staphylococcus: Secondary | ICD-10-CM

## 2015-01-20 DIAGNOSIS — R079 Chest pain, unspecified: Secondary | ICD-10-CM

## 2015-01-26 DIAGNOSIS — I4891 Unspecified atrial fibrillation: Secondary | ICD-10-CM

## 2015-02-07 ENCOUNTER — Ambulatory Visit
Admit: 2015-02-07 | Disposition: A | Discharge status: Home / Self Care | Payer: Self-pay | Attending: Family Medicine | Admitting: Family Medicine

## 2015-02-16 ENCOUNTER — Emergency Department: Payer: Self-pay | Admitting: Emergency Medicine

## 2015-02-17 ENCOUNTER — Encounter (HOSPITAL_COMMUNITY): Payer: Self-pay

## 2015-02-17 ENCOUNTER — Emergency Department (HOSPITAL_COMMUNITY)
Admission: EM | Admit: 2015-02-17 | Discharge: 2015-02-17 | Disposition: A | Discharge status: Home / Self Care | Payer: Medicare Other | Attending: Emergency Medicine | Admitting: Emergency Medicine

## 2015-02-17 DIAGNOSIS — Z87891 Personal history of nicotine dependence: Secondary | ICD-10-CM | POA: Insufficient documentation

## 2015-02-17 DIAGNOSIS — I10 Essential (primary) hypertension: Secondary | ICD-10-CM | POA: Insufficient documentation

## 2015-02-17 DIAGNOSIS — R197 Diarrhea, unspecified: Secondary | ICD-10-CM | POA: Diagnosis not present

## 2015-02-17 DIAGNOSIS — Z872 Personal history of diseases of the skin and subcutaneous tissue: Secondary | ICD-10-CM | POA: Diagnosis not present

## 2015-02-17 DIAGNOSIS — K529 Noninfective gastroenteritis and colitis, unspecified: Secondary | ICD-10-CM

## 2015-02-17 HISTORY — DX: Essential (primary) hypertension: I10

## 2015-02-17 NOTE — ED Notes (Signed)
Bed: WA22 Expected date:  Expected time:  Means of arrival:  Comments: EMS-fall 

## 2015-02-17 NOTE — ED Notes (Signed)
After calling [facility] for transport and discussing this with wife, wife reports that she is going to take the patient home in the car. [facility] cancelled.

## 2015-02-17 NOTE — Discharge Instructions (Signed)
At this time you are choosing to leave before a complete evaluation. Return to the emergency department at any time that you feel your condition is worsening or changing. Call your doctor today to coordinate your ongoing care.

## 2015-02-17 NOTE — ED Notes (Signed)
Pt presents with c/o diarrhea and bed sores. Per EMS, pt is coming from home, has had the diarrhea for 45 days and has been seen at [facility] times with no improvement. GI doctor told [facility]. Per EMS, home health reports that wife has not been keeping him clean. Home health has reported that they will not be seeing the patient any longer and the next step is a facility. Pt also has bed sores on his bottom.

## 2015-02-17 NOTE — ED Notes (Signed)
[facility] called for transport.  

## 2015-02-17 NOTE — ED Provider Notes (Signed)
CSN: [id number]     Arrival date & time 02/17/15  1527 History   First MD Initiated Contact with Patient 02/17/15 1552     Chief Complaint  Patient presents with  . Diarrhea  . Bed sores      (Consider location/radiation/quality/duration/timing/severity/associated sxs/prior Treatment) HPI The patient and his wife report that they did not intend to [facility]. This is a geographical inconvenient location for them. Apparently when calling EMS they anticipated being at [facility] where they have been treated multiple times before. They report there are no new problems today. He has had chronic ongoing diarrhea and problems with bedsores and general weakness. The patient reports he rather be at home and he feels no worse today than he has over the past several weeks. His wife reports they have in-home assistance and are in the process of working on added care at home. The patient's wife reports that she called EMS to bring him to the hospital, predominantly because there are just so many things that they are trying to arrange with his care that they felt it would be more easily done at the hospital. Both she and the patient were made aware that [facility] the [facility] system that they are being treated in. They report it was not a problem with the hospital but geographically this is just not a location that is reasonably accessible for the patient's wife. Both she and the patient reports if someone can assist him into her vehicle, she has help at home to get him back into the house and cared for. Past Medical History  Diagnosis Date  . Hypertension    History reviewed. No pertinent past surgical history. No family history on file. History  Substance Use Topics  . Smoking status: Former Games developermoker  . Smokeless tobacco: Not on file  . Alcohol Use: Not on file    Review of Systems Constitutional: No recent fever, no active pain complaint.   Allergies  Review  of patient's allergies indicates not on file.  Home Medications   Prior to Admission medications   Not on File   BP 118/70 mmHg  Pulse 110  Temp(Src) 98.3 F (36.8 C) (Oral)  Resp 18  SpO2 95% Physical Exam  Constitutional: He is oriented to person, place, and time.  The patient is awake alert and nontoxic. He has no respiratory distress. He is a moderately deconditioned appearing elderly gentleman.  HENT:  Head: Normocephalic and atraumatic.  Eyes: Conjunctivae and EOM are normal.  Cardiovascular: Normal rate and intact distal pulses.   Pulmonary/Chest: Effort normal.  Neurological: He is alert and oriented to person, place, and time.  Skin: Skin is warm and dry.  Psychiatric: He has a normal mood and affect.    ED Course  Procedures (including critical care time) Labs Review Labs Reviewed - No data to display  Imaging Review No results found.   EKG Interpretation None      MDM   Final diagnoses:  Chronic diarrhea   Prior to my evaluation the patient and wife expressed a desire to leave the hospital due to being transported to the wrong hospital from their perspective. I have done a screening examination on the patient and find that he is alert and in no acute distress with a clear mental status. He appears to have chronic medical problems and at this point in time they wish to leave the hospital and continue to resolve these with their known  providers.    Arby Barrette, MD 02/17/15 [phone]

## 2015-02-23 ENCOUNTER — Observation Stay: Payer: Self-pay | Admitting: Internal Medicine

## 2015-02-24 DIAGNOSIS — R000 Tachycardia, unspecified: Secondary | ICD-10-CM | POA: Diagnosis not present

## 2015-03-10 ENCOUNTER — Ambulatory Visit
Admit: 2015-03-10 | Disposition: A | Discharge status: Home / Self Care | Payer: Self-pay | Attending: Family Medicine | Admitting: Family Medicine

## 2015-04-01 NOTE — Consult Note (Signed)
PATIENT NAME:  Marcus Gates, Marcus Gates MR#:  [id number] DATE OF BIRTH:  06-16-1930  DATE OF CONSULTATION:  08/13/2014  CONSULTING PHYSICIAN:  Midge Miniumarren Joshus Rogan, MD  CONSULTING SERVICE: Gastroenterology.   REASON FOR CONSULTATION: Diarrhea.   HISTORY OF PRESENT ILLNESS: This patient is an 81 year old gentleman who has some dementia, who was admitted last year for fevers and altered mental status. At that time, the patient was found to have Escherichia coli and no source of the Escherichia coli was found. The patient was to follow up with GI as an outpatient but never did because he was feeling better. Now the patient was admitted because of loose stools over the last few weeks. The patient reports that he had part of his colon taken out by Dr. Michela PitcherEly back in 2005 due to diverticulitis. 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 The patient has reported only 1 bowel movement typically, which is soft every day. He states that the bowel movements have been soft ever since he had his colon resection. He also reports that at certain times he will have a second bowel movement at night. There is no report of any black stools or bloody stools. He also denies any nausea and vomiting.   PAST MEDICAL HISTORY: Hypertension, GERD, lower GI bleed, hyperlipidemia, diverticulosis, and laparoscopic surgery for colovesicular fistula, history of Escherichia coli sepsis and had a history of recent Clostridium difficile diarrhea.   ALLERGIES: MORPHINE, ROCEPHIN, AND ACE INHIBITORS.   FAMILY HISTORY: Noncontributory.   MEDICATIONS: Clonidine, hydrochlorothiazide, omeprazole, losartan, simvastatin, Flomax, Ambien, Remeron.   REVIEW OF SYSTEMS: A 10-point review of systems negative except what was stated above.   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 No apparent distress.  VITAL  SIGNS: Temperature 97.6, pulse 76, respirations 20, blood pressure 103/64, pulse oximetry 98%.  HEENT: Normocephalic, atraumatic. Extraocular motor intact. Pupils equally round and reactive to light and accommodation without JVD, without lymphadenopathy.  LUNGS: Clear to auscultation bilaterally.  HEART: Regular rate and rhythm without murmurs, rubs, or gallops.  ABDOMEN: Soft, nontender, nondistended, without hepatosplenomegaly, positive for abdominal scars from previous surgeries.  EXTREMITIES: Without cyanosis, clubbing, or edema.  NEUROLOGICAL: Grossly intact.  SKIN: Without any rashes or lesions.   ANCILLARY SERVICES: Creatinine increased at 2.39. LFTs normal on admission, hemoglobin 11.0, hematocrit 33.1.   Ultrasound of the kidneys due to his increased creatinine showed mild renal cortical atrophy bilaterally without hydronephrosis.   ASSESSMENT AND PLAN: This patient is an 81 year old gentleman who was admitted with diarrhea. The patient's nurse states that the patient has not been having enough diarrhea that has resulted in the nursing staff not being able to obtain a stool sample. The patient also reports he only had 1 bowel movement all day today and it was in the morning and he states it was his normal consistency. The patient eventually will need a colonoscopy due to his history of Escherichia coli sepsis and it appears he has not had a colonoscopy in some time. I discussed this with the patient's 2 daughters and have recommended that this not be done acutely while the patient is in the hospital especially without any stool samples or stool studies that have been collected or have had any results of. They agree and patient should follow up for a colonoscopy as an outpatient. Thank you very much for involving  me in the care of this patient. If you have any questions, please do not hesitate to call.    ____________________________ Midge Minium, MD dw:lt D: 08/13/2014 17:33:25  ET T: 08/13/2014 19:42:13 ET JOB#: [id number]  cc: Midge Minium, MD, <Dictator> Midge Minium MD ELECTRONICALLY SIGNED 08/16/2014 17:57

## 2015-04-01 NOTE — Discharge Summary (Signed)
PATIENT NAME:  Marcus SpragueSHARPE, Nyquan H MR#:  [id number] DATE OF BIRTH:  01-04-1930  DATE OF ADMISSION:  08/12/2014 DATE OF DISCHARGE:  08/16/2014  HISTORY OF PRESENT ILLNESS: Mr. Marcus FishmanSharpe is an 81 year old white gentleman who had been hospitalized in late June with fever and altered mental status. He was found to have E. coli sepsis at that time for which no definite etiology was found. Follow-up with GI had been recommended by the consulting infectious disease specialist; however, on follow-up in the office the patient declined. Over the last several weeks, however, he had began to deteriorate and began again having loose stools. He had no nausea or vomiting. He was recently seen in the Emergency Room at which time he was noted to be mildly dehydrated with a creatinine of 1.58. The patient was brought to the office stating that he was not eating or drinking. Follow-up labs showed his creatinine was up to 3.8. He was therefore admitted with acute renal failure.   PAST MEDICAL HISTORY: Notable for essential hypertension, gastroesophageal reflux disease, a history of a lower GI bleed, hyperlipidemia, diverticulosis, and previous laparoscopic surgery for colovesicular fistula in 2005.   ALLERGIES: MORPHINE, ROCEPHIN, AND ACE INHIBITORS.   MEDICATIONS ON ADMISSION: Included clonidine 0.1 mg b.i.d., hydrochlorothiazide 25 mg daily, losartan 50 mg daily, omeprazole 20 mg daily, simvastatin 20 mg at bedtime, Flomax 0.4 mg daily, Ambien 10 mg at bedtime p.r.n., and Remeron 7.5 mg at bedtime as an appetite stimulant.  ADMISSION PHYSICAL EXAMINATION: As described in the admission note. It was notable for poor skin turgor. He was also noted to have a slightly irregular rhythm.   HOSPITAL COURSE: The patient was admitted to the regular floor where he was placed on telemetry. He was started on rehydration with IV fluids. Blood cultures were obtained on admission as was a urine culture. Blood cultures were negative. Urine  culture showed mixed bacterial organisms suggestive of contamination. Renal ultrasound showed mild renal cortical atrophy bilaterally but no hydronephrosis. The patient was seen in consultation both by nephrology and gastroenterology. With IV fluids his renal function improved and he was felt to have dehydration with prerenal azotemia. The GI consultant felt that the patient did eventually need GI follow-up but thought it could be done as an outpatient. The patient's final renal profile showed a BUN of 21 with a creatinine of 1.67. Total protein was 5.2 with an albumin of 2.3. Final CBC showed a hemoglobin of 10.2 with a hematocrit of 30.9. White count was 4700. Platelet count was 273,000.   DISCHARGE DIAGNOSIS: Acute renal failure due to prerenal azotemia.   DISCHARGE MEDICATIONS: 1.  Omeprazole 20 mg daily.  2.  Clonidine 0.1 mg b.i.d.  3.  Flomax 0.4 mg daily. 4.  Simvastatin 40 mg at bedtime.  5.  Ambien 10 mg at bedtime p.r.n.  6.  Remeron 7.5 mg at bedtime.   NOTE: The patient was taken off of his hydrochlorothiazide and his Cozaar.   DISCHARGE INSTRUCTIONS: The patient was discharged on a low sodium diet with activity as tolerated. He is to be followed up in the office in 1 to 2 weeks.  ____________________________ Letta PateJohn B. Danne HarborWalker III, MD jbw:sb D: 09/02/2014 07:36:40 ET T: 09/02/2014 11:12:24 ET JOB#: [id number]  cc: Jonny RuizJohn B. Danne HarborWalker III, MD, <Dictator> Elmo PuttJOHN B WALKER III MD ELECTRONICALLY SIGNED 09/05/2014 7:27

## 2015-04-01 NOTE — H&P (Signed)
PATIENT NAME:  Marcus SpragueSHARPE, Domingo H MR#:  [id number] DATE OF BIRTH:  October 20, 1930  DATE OF ADMISSION:  08/12/2014  HISTORY OF PRESENT ILLNESS: [name] is an 81 year old gentleman who had recently been hospitalized in late June with fever and altered mental status. He was found to have E. coli sepsis for which no definite etiology was determined. He was also suspected of possibly having had recent exposure to [facility] Spotted Fever. At the time of discharge, followup with GI was recommended; however, when the patient was seen in follow-up in the office, he was feeling better and declined. Over the last several weeks, however, he has begun to deteriorate and again has been having loose stools. He has had no nausea or vomiting. He has had anorexia. He was recently seen in the Emergency Room where his laboratory work was notable only for mild dehydration. He had a creatinine of 1.58 at that time. Recent lab done the day before yesterday showed his creatinine to be up to 3.8. He was brought to the office today by his family who states he is not eating or drinking. The patient is therefore being admitted with acute renal failure.   PAST MEDICAL HISTORY: Notable for hypertension, gastroesophageal reflux, a history of lower GI bleed, hyperlipidemia, a history of diverticulosis, a history of laparoscopic surgery for colovesicular fistula in 2005, a history of recent E. coli sepsis, and a history of recent C. diff diarrhea.   ALLERGIES: MORPHINE AND ROCEPHIN. According to his hospital record, he is allergic to ACE INHIBITORS.   FAMILY AND SOCIAL HISTORY: Unchanged since his most recent admission.   CURRENT MEDICATIONS: Clonidine 0.1 mg b.i.d., hydrochlorothiazide 25 mg daily, losartan 50 mg daily, omeprazole 20 mg daily, simvastatin 20 mg at bedtime, Flomax 0.4 mg daily, Ambien 10 mg at bedtime p.r.n., and Remeron 7.5 mg at bedtime as an appetite stimulant.   REVIEW OF SYSTEMS: As obtained from the patient, his  wife and his daughter was unremarkable with the exception of the present illness.   PHYSICAL EXAMINATION: VITAL SIGNS: The patient is 5 feet 6 and weighs 178 pounds. Blood pressure is 110/58 and pulse is 88 and regular.  GENERAL: The patient is alert and oriented. He does look both acutely and chronically ill. He was seen in a wheelchair initially in the office. According to his wife, he has difficulty ambulating on his own. SKIN: Turgor was poor. There were no rashes or petechiae.  HEENT: Unremarkable.  NECK: Supple. There was no JVD. There were no carotid bruits.  CHEST: There is an increased AP diameter to the chest.  LUNGS: Fields are clear to auscultation and percussion.  CARDIAC: Irregular rhythm. No murmurs or gallops are appreciated. S1 and S2 were normal.  ABDOMEN: Soft and nontender. There are no masses or organomegaly. Bowel sounds were normal.  GENITAL AND RECTAL: Deferred.  EXTREMITIES: No edema or deformity.  NEUROLOGIC: Felt to be grossly physiological.   ASSESSMENT AND PLAN: The patient is being admitted to the regular medical floor where he will be rehydrated with IV fluids. We will follow his renal function with serial basic metabolic panels. Nephrology consultation has been requested. In addition, due to his history of chronic diarrhea and previous Clostridium difficile positive study, gastroenterology will be consulted. 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   The patient is currently a FULL code.  ____________________________ Letta PateJohn B. Danne HarborWalker III, MD jbw:sb  D: 08/12/2014 16:38:22 ET T: 08/12/2014 17:02:57 ET JOB#: [id number]  cc: Letta Pate. Danne Harbor, MD, <Dictator> Elmo Putt III MD ELECTRONICALLY SIGNED 08/14/2014 17:45

## 2015-04-01 NOTE — Consult Note (Signed)
PATIENT NAME:  Marcus Gates, Marcus Gates MR#:  [id number] DATE OF BIRTH:  [birth year]  DATE OF CONSULTATION:  06/03/2014  CONSULTING PHYSICIAN:  Stann Mainlandavid P. Sampson GoonFitzgerald, MD  REFERRING PHYSICIAN: Dr. Mordecai MaesSanchez.  REASON FOR CONSULTATION: Fever, tick bite, abnormal LFTs.   HISTORY OF PRESENT ILLNESS: This is a pleasant 81 year old gentleman with a history of hypertension and BPH who states he was in his usual state of health yesterday. However, at 3:00 a.m. he woke up feeling fevers and malaise. He was achy all over. He was also very weak. He was brought in by EMS and found to have a temperature of 101. He also ended up having markedly elevated LFTs as well as tachycardia and leukopenia. He was also noted to have a rash. So far he has had cultures and an ultrasound of his abdomen as well as CT without contrast of the abdomen. There is evidence of gallstones and some gallbladder thickening but no evidence of acute cholecystitis. The patient states that he has been in his usual state of health over the last several days. He does occasionally find ticks on him. He and his wife live on 8 acres and have 2 dogs at home. They do often have ticks. He has had no other unusual exposures. He did see his grandchildren for Father's Day but they are in their teens and none of them were sick. He has had no unusual travel. 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   PAST MEDICAL HISTORY:  1. Hypertension.  2. BPH.  3. Previous intra-abdominal abscess status post laparoscopy, in 2005. 4. Prior history of GI bleed.  5. Diverticulosis.   PAST SURGICAL HISTORY: Laparoscopy and drainage of abscess in 2005.   ALLERGIES: ALLERGIC TO MORPHINE AND ACE INHIBITORS.   SOCIAL HISTORY: He lives with his wife. Has children and grandchildren. 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 He quit smoking many years ago. He denies any alcohol or drugs.   FAMILY HISTORY: Father with lung cancer and mother with  CVA.   CURRENT MEDICATIONS: Unclear what he was taking as an outpatient. He denies any new drugs or medications. Current antibiotics include doxycycline. He also has been started on metronidazole with a positive Clostridium difficile testing.   REVIEW OF SYSTEMS: Eleven systems reviewed and negative except as per HPI.   PHYSICAL EXAMINATION:  VITAL SIGNS: Temperature 97.9, pulse 119, blood pressure 147/87, respirations 18, saturation is 96% on room air.  GENERAL: He is pleasant, interactive, somewhat confused, but in no acute distress.  HEENT: Pupils equal, round, and reactive to light and accommodation. Extraocular movements are intact. Sclerae anicteric. Oropharynx is clear. Mucous membranes are dry.  NECK: Supple. No anterior cervical, posterior cervical, or supraclavicular lymphadenopathy.  HEART: Regular.  LUNGS: Clear to auscultation bilaterally.  ABDOMEN: Obese, soft, nontender, nondistended.  EXTREMITIES: He has 1+ bilateral lower extremity edema with some chronic venous stasis.  SKIN: He has a diffuse maculopapular eruption on his abdomen.  LABORATORY DATA: White blood count was low at 2.5, hemoglobin 12.9, platelets 152,000. No differential is available on the CBC. CPK was normal at 131 creatinine is elevated at 1.67. Sodium is slightly low at 135. AST is markedly elevated at 1555, ALT 482, total protein 6.9, albumin is 2.9. Alkaline phosphatase 209, total bilirubin 2.6. Troponins were negative. Lipase was elevated at 735, magnesium 0.8, phosphorus 1.8. Blood cultures x 2 are pending. Lactic acid was 2.0.   Chest x-ray showed no acute cardiopulmonary disease.  ultrasound of the abdomen showed evidence of gallstones and sludge within the gallbladder. The gallbladder wall was thickened at 4.3 mm. Negative Murphy sign. Clostridium difficile sample is positive. CT of the abdomen without contrast showed minimal stranding within the perinephric fat. There is an ill-defined indeterminate low  attenuation density in the left lobe of the liver, diffuse diverticulosis without diverticulitis. There are multiple fat-containing ventral abdominal wall hernias and small gallstones are seen.   IMPRESSION: An 81 year old gentleman admitted with rather sudden onset of fevers, tachycardia, fatigue, and diarrhea. He also has increased liver function tests with AST quite high, out of proportion to ALT. He denies alcohol use. [place] and his wife has found multiple ticks on her although he does not recall any.   There is a great probability of a tickborne illness so I would continue the doxycycline; however, I am also concerned about a biliary process with the gallstones and sludge although his liver function tests are not elevated in a typical obstructive pattern. The Clostridium difficile being positive is also somewhat of a surprise as he had not been having diarrhea prior to last night.   RECOMMENDATIONS:  1. Continue doxycycline.  2. Continue IV Flagyl. I have also added oral vancomycin.  3. Consider gastroenterology consult to further evaluate the liver abnormalities.  4. Await blood culture results as well as serologies for tickborne illnesses.  5. Check a CBC with differential in a.m.  6. Thank you for the consult. I will be glad to follow with you.     ____________________________ Stann Mainland. Sampson Goon, MD dpf:lt D: 06/03/2014 20:30:31 ET T: 06/04/2014 02:54:40 ET JOB#: [id number]  cc: Stann Mainland. Sampson Goon, MD, <Dictator> DAVID Sampson Goon MD ELECTRONICALLY SIGNED 06/27/2014 21:11

## 2015-04-01 NOTE — Consult Note (Signed)
PATIENT NAME:  Marcus Gates, Marcus Gates MR#:  [id number] DATE OF BIRTH:  06-15-1930  NEPHROLOGY CONSULTATION   DATE OF CONSULTATION:  08/13/2014  REFERRING PHYSICIAN:  John B. Sarina Ser, MD CONSULTING PHYSICIAN:  Luanne Krzyzanowski Lilian Kapur, MD  REASON FOR CONSULTATION: Acute renal failure, chronic kidney disease stage III.   HISTORY OF PRESENT ILLNESS: The patient is an 81 year old Caucasian male with past medical history of hypertension, GERD, history of lower GI bleed, hyperlipidemia, diverticulosis, history of colovesical fistula in 2005, status post surgical repair, history of E. coli sepsis, history of C. difficile colitis, who presented to [facility] with weakness and weight loss. Overall, the patient reports that he has had diminished appetite over the past several weeks. He has also had weight loss of at least 15 pounds per his report. We are asked to see him for acute renal failure. The patient's baseline creatinine appears to be 1.6 from 06/03/2014. Apparently, his creatinine has been as high as 3.8 recently, but it is currently down to 2.39. Sodium is currently 135 as well. The patient denies nausea, vomiting, bloody stools. He has had some recent diarrhea. He has been taking BC Powders intermittently as well.   PAST MEDICAL HISTORY:  1. Hypertension.  2. GERD.  3. History of lower GI bleed.  4. Hyperlipidemia.  5. Diverticulosis.  6. History of laparoscopic surgery for colovesical fistula in 2005.  7. History of E. coli sepsis.  8. History of recent C. difficile colitis.   ALLERGIES: MORPHINE AND ROCEPHIN.   CURRENT INPATIENT MEDICATIONS: Include:  1. 0.9 normal saline at 100 mL/hour.  2. Acetaminophen 650 mg p.o. q.4 hours p.r.n. 3. Clonidine 0.1 mg p.o. b.i.d.  4. Remeron 7.5 mg p.o. at bedtime. 5. Protonix 40 mg p.o. at 6:00 a.m.  6. Zocor 20 mg p.o. at bedtime.  7. Flomax 0.4 mg p.o. at bedtime.   SOCIAL HISTORY: The patient resides close to [facility]. He is married.  He has 3 adult children. He used to sell carpeting. He has a remote history of tobacco abuse and quit smoking at age 53. He drinks alcohol occasionally. Denies illicit drug use.   FAMILY HISTORY: Significant for CVA and cerebral hemorrhage.   REVIEW OF SYSTEMS:  CONSTITUTIONAL: Denies fevers or chills, but has had significant weight loss of approximately 15 pounds over the past several weeks.  EYES: Denies diplopia or blurry vision.  HEENT: Denies headaches or hearing loss. Denies epistaxis.  CARDIOVASCULAR: Denies chest pain, palpitations, PND.  RESPIRATORY: Denies cough, shortness of breath, hemoptysis.  GASTROINTESTINAL: Denies nausea, vomiting. Has had some diarrhea.  GENITOURINARY: Denies frequency, urgency or dysuria.  MUSCULOSKELETAL: Denies joint pain, swelling or redness.  INTEGUMENTARY: Denies skin rashes or lesions.  NEUROLOGIC: Denies focal extremity numbness, weakness or tingling.  PSYCHIATRIC: Denies bipolar disorder.  ENDOCRINE: Denies polyuria, polydipsia or polyphagia.  HEMATOLOGIC AND LYMPHATIC: Denies easy bruisability, bleeding or swollen lymph nodes.  ALLERGY AND IMMUNOLOGIC: Denies seasonal allergies or history of immunodeficiency.   PHYSICAL EXAMINATION:  VITAL SIGNS: Temperature 97.5, pulse 87, respirations 20, blood pressure 124/70, pulse oximetry 98%.  GENERAL: A well-developed, well-nourished Caucasian male who appears his stated age, currently in no acute distress.  HEENT: Normocephalic, atraumatic. Extraocular movements are intact. Pupils equal, round and reactive to light. No scleral icterus. Conjunctivae are pink. No epistaxis noted. Gross hearing intact. Oral mucosa moist.  NECK: Supple, without JVD, lymphadenopathy or bruits.  LUNGS: Clear to auscultation bilaterally with normal respiratory effort.  CARDIOVASCULAR: S1, S2, regular rate  and rhythm. No murmurs, rubs or gallops appreciated.  ABDOMEN: Soft, nontender, nondistended. Bowel sounds positive. No  rebound or guarding. No gross organomegaly appreciated. There is an abdominal hernia noted.  EXTREMITIES: No clubbing, cyanosis or edema.  NEUROLOGIC: The patient is awake, alert and oriented to time, person and place. Strength is 5 out of 5 in both upper and lower extremities.  GENITOURINARY: No suprapubic tenderness is noted at this time.  MUSCULOSKELETAL: No joint redness, swelling or tenderness appreciated.  PSYCHIATRIC: The patient has appropriate affect and appears to have good insight into his current illness.   LABORATORY DATA: Sodium 135, potassium 3.6, chloride 105, CO2 19, BUN 35, creatinine 2.39, glucose 98, total protein 5.7, albumin 2.5, bilirubin 0.3, alkaline phosphatase 78, AST 14, ALT 17. CBC shows WBC is 5.4, hemoglobin 11, hematocrit 33, platelets 284. Blood cultures x2 sets are thus far negative. Urinalysis is unremarkable. No protein or blood noted.   IMPRESSION: This is an 81 year old Caucasian male with past medical history of hypertension, gastroesophageal reflux disease, history of lower gastrointestinal bleed, hyperlipidemia, diverticulosis, history of laparoscopic surgery for colovesical fistula in 2005, history of Escherichia coli sepsis and recent Clostridium difficile colitis, who presented to [facility] with malaise, weight loss, poor appetite, acute renal failure.  1. Acute renal failure. 2. Chronic kidney disease, stage III.  3. Unexplained weight loss.  4. Anemia, not otherwise specified.   PLAN: Thus far, a unifying diagnosis has not been found for his recent weight loss, anorexia, acute renal failure and anemia. Plasma cell dyscrasia is a concern given the weight loss, anemia and acute renal failure. We will proceed with further serologic workup, including SPEP, UPEP, ANA, ANCA antibodies, GBM antibodies, C3, C4, urine for eosinophils and renal ultrasound. Agree with IV fluid hydration for now to treat any underlying volume depletion.  Continue to monitor serum creatinine over the next several days. No urgent indication for renal biopsy or dialysis at this time. Further plan based upon results obtained from workup.   I would like to thank Dr. Gilford Rile for this kind referral.    ____________________________ Tama High, MD mnl:lb D: 08/13/2014 07:36:44 ET T: 08/13/2014 07:58:41 ET JOB#: [id number]  cc: Tama High, MD, <Dictator> Mariah Milling Jenisha Faison MD ELECTRONICALLY SIGNED 08/25/2014 16:01

## 2015-04-01 NOTE — H&P (Signed)
PATIENT NAME:  Marcus Gates, Marcus Gates MR#:  [id number] DATE OF BIRTH:  June 29, 1930  DATE OF ADMISSION:  06/03/2014  PRIMARY CARE PHYSICIAN: [facility].  REFERRING PHYSICIAN: Charlestine NightPhillip A. Scotty CourtStafford, MD  CHIEF COMPLAINT: Not feeling very well and fever of 101.   HISTORY OF PRESENT ILLNESS: This is a very nice 81 year old gentleman with history of hypertension, BPH, previous diverticulosis, who comes today after being in his normal state of health, complaining of fever and malaise. The patient apparently went to bed last night at midnight feeling normal. He slept through the night, and in the morning, woke up feeling really weak, tired and achy. The patient was not able to get out of bed, he was just profoundly weak, and his wife was not able to help him, for which she called EMS. When the EMS arrived, the patient had a temperature of 101, for which he was brought up into the Emergency Department. The patient is a very poor historian. I think that there might be some cognitive disorder in the patient and the wife as well, but overall, he is pleasant, and he knows where he is. He just does not have any clear idea of his medical history. The patient was seen in the past over here after having a near-syncopal episode, and this is where I get most of my records. The patient, at that moment, was bradycardic, but when he was discharged, he was discharged in good condition. So, when he was evaluated here in the Emergency Department, the patient was now afebrile, but significantly tachycardic with heart rates in the 140s, looked dry, dehydrated. He has elevation of creatinine, which is chronic as the patient had it elevated the last time he was here. He had a slightly decreased sodium of 135. He has very low phosphorus and very low magnesium, which were repleted, and his LFTs are significantly elevated. His cardiac enzymes are normal, and he has had leukopenia. The patient says that he has been doing okay otherwise and has not been  sick. After examination, I noticed that the patient has a significant rash on the chest, on top of the dorsum of hands and legs and abdomen, and it is papular. When I asked him about this rash, he said they did not notice it until today. After reviewing his data and raised the suspicion [facility], I asked him about tick bites, and he said that he is finding a lot of ticks recently. The wife had multiple tick bites within the last week, and they were able to find one on this patient at the level of the chest which left a red mark after removed, and it has been within a week from now. The patient had elevation of LFTs, with elevation of bilirubin, for which an ultrasound of the abdomen was done, showing some significant thickening of the gallbladder, sludge and stones. Dr. Scotty CourtStafford consulted surgery. Surgery was not impressed. They have not seen the patient, but since the patient was not having any pain, they recommended medical admission. We were consulted to admit the patient.   REVIEW OF SYSTEMS:  CONSTITUTIONAL: Positive fever. Positive fatigue and weakness. No significant weight loss.  EYES: No blurry vision, double vision.  ENT: No difficulty swallowing or tinnitus.  RESPIRATORY: No cough, wheezing or shortness of breath. He used to smoke before, but now he is fine. He denies any COPD.  CARDIOVASCULAR: No chest pain, orthopnea or edema. No displacement of PMI.  GASTROINTESTINAL: Positive nausea. No abdominal  pain at this moment. No constipation or diarrhea.  GENITOURINARY: No dysuria or hematuria.  HEMATOLOGIC AND LYMPHATIC: No anemia, easy bruising or bleeding.  ENDOCRINOLOGY: No polyuria or polydipsia.  SKIN: Positive rash. No masses.  MUSCULOSKELETAL: No significant neck pain or back pain.  NEUROLOGIC: No numbness or tingling. No CVAs.  PSYCHIATRIC: No insomnia or depression.   PAST MEDICAL HISTORY:  1. Hypertension.  2. Previous intra-abdominal abscess, status post  laparoscopy in 2005.  3. BPH.  4. Diverticulosis.  5. History of GI bleeding in the past.   PAST SURGICAL HISTORY: Laparoscopy and drainage of abscess in 2005 on the abdominal cavity.   ALLERGIES: APPARENTLY, THE PATIENT IS ALLERGIC TO MORPHINE AND ACE INHIBITORS. HE DOES NOT KNOW WHAT THE REACTION IS.   SOCIAL HISTORY: The patient does not smoke; he quit at the age of 46 after his dad was diagnosed with lung cancer. Denies any alcohol. No drugs. He used to work as a Careers adviser.   FAMILY HISTORY: His father died from lung cancer complications and a CVA with a hemorrhagic stroke at the age of 59. His mom died from a stroke at the age of 34.   CURRENT MEDICATIONS: Unable to obtain as the patient does not have it, does not remember what he takes, and we are requesting records from the [facility]. Will update as soon as we have them back.   PHYSICAL EXAMINATION:  VITAL SIGNS: Blood pressure 150/79, initially his blood pressure was 109/57, his heart rate initially was 131, right now it is 108 after IV fluids, temperature currently 98.1, as high as 101.2 by EMS, oxygen saturation 97% on room air.  GENERAL: The patient is alert and oriented x3, in no acute distress at this moment. No respiratory distress. He is starting to feel a little bit better.  HEENT: Pupils are equal, round and reactive. Extraocular movements are intact. Mucosae are dry. Anicteric sclerae. Pink conjunctivae. No oral lesions. No oropharyngeal exudates.  NECK: Supple. No JVD. No thyromegaly. No adenopathy. No carotid bruits. No rigidity. No lymphadenopathy.  CARDIOVASCULAR: Regular rate and rhythm, tachycardic. No murmurs, rubs or gallops are appreciated at this moment. No displacement of PMI. No tenderness to palpation of anterior chest wall.  LUNGS: Clear, without any wheezing or crepitus. No use of accessory muscles.  ABDOMEN: Soft, nontender, nondistended. No hepatosplenomegaly. No masses. Bowel sounds are positive. No  Murphy. No McBurney. His belly is very benign.  GENITAL: Negative for external lesions.  EXTREMITIES: Trace edema of the lower extremities, which is chronic.  MUSCULOSKELETAL: No joint effusions or joint swelling.  VASCULAR: Pulses +2. Capillary refill less than 3.  SKIN: Some erythema at the level of the anterior tibia, but no signs of cellulitis. There is significant rash spread out from the dorsum of abdomen, up on the arms, on the dorsum of the hands, on the dorsum of the feet and confluent onto the beginning of the palms and beginning of the soles. The rash is pink, maculopapular and diffuse.  NEUROLOGIC: Cranial nerves II through XII intact. His strength is 5 out of 5 in all 4 extremities.  PSYCHIATRIC: No agitation. The patient is alert and oriented x3, very pleasant. Has some issues with what appears to be long-term memory, but short-term memory seems to be stable right now.   RESULTS: Creatinine 1.67, previous creatinine around 1.76. The patient does have chronic kidney disease. BUN 25, glucose 91. His lipase is slightly elevated at 735. His phosphorus is 1.8, down. Magnesium is  down to 0.8. Total protein 6.2, albumin 2.9, bilirubin 2.6 total, alkaline phosphatase 209, AST 1500, ALT 482. We are going to add direct bilirubin to evaluate if it is direct or indirect. His total CK is 131. Troponin is negative. White blood count of 2.5, hemoglobin 12.9, platelet count 152, prior platelet count was 213. Urinalysis negative for signs of urinary tract infection. Lactic acid is 2.0. EKG: No ST depression or elevation. Some junctional tachycardia, mostly sinus tachycardia, once I reviewed the EKG, it looks more like sinus. Ultrasound of the abdomen showed gallstones within the gallbladder, 7 mm, sludge, gallbladder wall is thickened at 4.3 mm. No sonographic Murphy. No evidence of pericholecystic fluid. CT scan of the abdomen and pelvis showed minimal scarring versus atelectasis of the lung bases. Minimal  stranding within the perinephric fat bilaterally, likely incidental finding, correlation with urinalysis recommended. Ill-defined indeterminate low attenuation density in the left lobe of the liver. Needs an MRI for that. Diffuse diverticulosis, without diverticulitis. Multiple ventral hernias and small gallstones within the gallbladder.   ASSESSMENT AND PLAN: An 81 year old gentleman admitted with fever, malaise and a rash, with liver failure.   1. Fever and malaise. The patient developed this overnight, fever of 101. He has low white blood count. He has a diffuse papular rash that goes into the dorsum of the hands and feet and is starting to cross into the line of the palm, with low sodium, low white blood count and platelet drop from original number. I have to suspect [facility] spotted fever versus any other rickettsial infection like Lyme disease and Ehrlichia. The patient has slight headache as well. The patient has multiple interactions with ticks, and he removed a tick from his chest within the last week and left a rash. The patient is going to be started on doxycycline, but since the patient also had some findings of thickening of the gallbladder, we are going to start him on Zosyn as well. Stop Zosyn if no other significant findings of infection. Infectious disease consultation sent. Titers for [facility] spotted fever, Ehrlichia and Lyme disease have been sent as well. Infectious disease consult to be obtained.  2. Liver, elevated LFTs with bilirubin and AST. At this moment, there are no signs of obstruction of the biliary duct, no dilation. There is some thickening of the gallbladder, but it is nonspecific. Surgery was consulted over the phone by the ER doctor, and they did not feel that this would be secondary to cholecystitis. After evaluating the patient, it seems like the elevation of LFTs could be secondary to rickettsial infection, for which we are going to evaluate for.  3.  Tachycardia. The patient was significantly tachycardic, likely secondary to sepsis and systemic inflammatory response syndrome. The patient meets criteria based on elevation of heart rate, severe sepsis with elevation of lactic acid, and the patient was also tachypneic, with respiratory rate of 24, with white blood count of 2.5. The patient treated with doxycycline for possible rickettsial and with Zosyn for possible intra-abdominal infection.  4. Hypertension. Hold blood pressure medications for now. We do not have medications from the [place] anyway.  5. Diverticulosis, not active.  6. Benign prostatic hypertrophy, seems to be stable. The patient does not have any significant signs of obstruction.  7. History of gastrointestinal bleeding in the past. The patient is actually on gastrointestinal prophylaxis with Protonix.  8. Deep vein thrombosis prophylaxis with heparin and monitor platelets as Rickettsia could produce further drop of platelets.  9.  Chronic kidney disease. The patient has elevation of creatinine, which apparently is chronic. Continue to monitor. IV fluids given.  10. Hypophosphatemia and hypomagnesemia. Replace.  11. Elevation of lipase. No signs of pancreatitis. This is likely secondary to the intra-abdominal inflammatory process.  12. Leukopenia. 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   CODE STATUS: The patient is a full code. He states that he would not like any surgery. If there is any need, he will defer it if possible.   We are going to continue to monitor LFTs, continue to monitor white blood cells and kidney function, etc.   TIME SPENT: I spent about 60 minutes with this patient today.    ____________________________ Felipa Furnace, MD rsg:lb D: 06/03/2014 14:30:01 ET T: 06/03/2014 15:21:23 ET JOB#: [id number]  cc: Felipa Furnace, MD, <Dictator> Merlyn Conley Juanda Chance MD ELECTRONICALLY SIGNED 06/23/2014 1:38

## 2015-04-01 NOTE — H&P (Signed)
PATIENT NAME:  Marcus Gates, Marcus Gates MR#:  [id number] DATE OF BIRTH:  03-03-1930  DATE OF ADMISSION:  06/03/2014  ADDENDUM   The patient had a significant amount of diarrhea. Repeat test showed positive C. diff. We also repeated his urinalysis, and this has 89 white blood cells. We are going to send this for a culture. The patient was started on metronidazole. Stop Zosyn since it is unlikely that his gallbladder is infected. Continue to monitor.   ____________________________ Felipa Furnaceoberto Sanchez Gutierrez, MD rsg:gb D: 06/03/2014 19:53:34 ET T: 06/03/2014 20:24:39 ET JOB#: [id number]  cc: Felipa Furnaceoberto Sanchez Gutierrez, MD, <Dictator> ROBERTO Juanda ChanceSANCHEZ GUTIERRE MD ELECTRONICALLY SIGNED 06/23/2014 1:38

## 2015-04-01 NOTE — Discharge Summary (Signed)
PATIENT NAME:  Marcus Gates, Marcus Gates MR#:  [id number] DATE OF BIRTH:  December 21, 1929  DATE OF ADMISSION:  06/04/2014 DATE OF DISCHARGE:  06/07/2014  DISCHARGE DIAGNOSES:  1. Escherichia coli sepsis present on admission.  2. Possible [facility] fever. 3. Clostridium difficile colitis. 4. Dementia.  5. Acute renal failure.   DISCHARGE MEDICATIONS: 1. Doxycycline 100 mg p.o. b.i.d. 2. Cipro 500 mg q. 12 hours. 3. Flagyl 500 mg p.o. q. 8 hours.  4. Clonidine 0.1 mg p.o. b.i.d.  5. Simvastatin 20 mg 2 tablets daily. 6. Hydrochlorothiazide 25 mg 1 tablet daily.  7. Omeprazole 20 mg p.o. daily.  8. Flomax 0.4 mg p.o. daily.   Metronidazole is given for two weeks, Doxycycline is given for 2 days and Cipro is given for 10 days.   CONSULTATIONS: ID  with Dr. Sampson GoonFitzgerald.   DISCHARGE SUMMARY: This is an 81 year old male patient admitted because of not feeling well and fever of 101. The patient had generalized weakness and he also had tachycardia and he was admitted to hospitalist service for sepsis. Look at the history and physical for full details. The patient had a rash on his palms so we thought it could be RMSF versus Lyme or ehrlichiosis. The patient was started on doxycycline and also Zosyn and the patient's blood cultures showed Escherichia coli in the blood but no clear source was identified. Urine cultures were negative. The only positive thing is he had gallbladder thickening and also some small gallstones within the gallbladder. So anyway, his sepsis improved. Repeat blood cultures were negative. Dr. Sampson GoonFitzgerald saw the patient. He recommended outpatient followup and outpatient GI evaluation. Dr. Dan HumphreysWalker is his primary doctor so Dr. Sampson GoonFitzgerald notified Dr. Dan HumphreysWalker that this patient needs a GI workup as an outpatient to evaluate for Escherichia coli sepsis.  2. Clostridium difficile colitis. The patient's stool cultures came back positive for Clostridium difficile, and he started on p.o.  vancomycin, and Flagyl and his diarrhea at the time of discharge and the patient discharged home with Flagyl alone. The patient needed Flagyl for 14 days and the patient was given doxycycline and Dr. Sampson GoonFitzgerald recommended 5 days total so we gave him another day to reach 5 days. The patient's Lyme serology and ehrlichiosis were not available at the time of discharge. The patient initial white count was normal. 3. Acute renal failure. BUN 33, creatinine 1.76 on admission.  4. He also has low magnesium of 0.8 admission, which is improved and his renal failure and magnesium improved with replacement and IV fluids and magnesium replacement in IV. The patient ehrlichiosis and Lyme disease results came back. Ehrlichiosis was negative and Lyme titers were also negative and these were available after the patient was discharged and the RMSF serology is negative.. The patient repeat magnesium was 1.4 and the patient also had an echocardiogram because of positive blood cultures which showed ejection fraction 50% but no vegetation. Repeat blood cultures were negative and patient went home in stable condition.   DISCHARGE VITAL SIGNS: Temperature was 98.2. Heart rate is 98. Blood pressure 150/80, saturation 97% on room air.   TIME SPENT ON DISCHARGE: More than 30 minutes.    ____________________________ Katha HammingSnehalatha Hays Dunnigan, MD sk:lt/am D: 06/20/2014 13:50:12 ET T: 06/21/2014 01:40:34 ET JOB#: [id number]  cc: Katha HammingSnehalatha Darly Fails, MD, <Dictator> Katha HammingSNEHALATHA Mirela Parsley MD ELECTRONICALLY SIGNED 06/23/2014 18:52

## 2015-04-05 DIAGNOSIS — A419 Sepsis, unspecified organism: Secondary | ICD-10-CM | POA: Diagnosis not present

## 2015-04-06 ENCOUNTER — Inpatient Hospital Stay
Admission: EM | Admit: 2015-04-06 | Discharge: 2015-04-10 | DRG: 872 | Attending: Internal Medicine | Admitting: Internal Medicine

## 2015-04-06 DIAGNOSIS — I82621 Acute embolism and thrombosis of deep veins of right upper extremity: Secondary | ICD-10-CM | POA: Diagnosis present

## 2015-04-06 DIAGNOSIS — N183 Chronic kidney disease, stage 3 (moderate): Secondary | ICD-10-CM | POA: Diagnosis present

## 2015-04-06 DIAGNOSIS — A419 Sepsis, unspecified organism: Secondary | ICD-10-CM | POA: Diagnosis present

## 2015-04-06 DIAGNOSIS — Z881 Allergy status to other antibiotic agents status: Secondary | ICD-10-CM | POA: Diagnosis not present

## 2015-04-06 DIAGNOSIS — I4891 Unspecified atrial fibrillation: Secondary | ICD-10-CM | POA: Diagnosis present

## 2015-04-06 DIAGNOSIS — I4430 Unspecified atrioventricular block: Secondary | ICD-10-CM | POA: Diagnosis present

## 2015-04-06 DIAGNOSIS — Z885 Allergy status to narcotic agent status: Secondary | ICD-10-CM

## 2015-04-06 DIAGNOSIS — I959 Hypotension, unspecified: Secondary | ICD-10-CM | POA: Diagnosis not present

## 2015-04-06 DIAGNOSIS — G4700 Insomnia, unspecified: Secondary | ICD-10-CM | POA: Diagnosis present

## 2015-04-06 DIAGNOSIS — R627 Adult failure to thrive: Secondary | ICD-10-CM | POA: Diagnosis present

## 2015-04-06 DIAGNOSIS — L89152 Pressure ulcer of sacral region, stage 2: Secondary | ICD-10-CM | POA: Diagnosis present

## 2015-04-06 DIAGNOSIS — E785 Hyperlipidemia, unspecified: Secondary | ICD-10-CM | POA: Diagnosis present

## 2015-04-06 DIAGNOSIS — N400 Enlarged prostate without lower urinary tract symptoms: Secondary | ICD-10-CM | POA: Diagnosis present

## 2015-04-06 DIAGNOSIS — R609 Edema, unspecified: Secondary | ICD-10-CM

## 2015-04-06 DIAGNOSIS — Z823 Family history of stroke: Secondary | ICD-10-CM

## 2015-04-06 DIAGNOSIS — Z9049 Acquired absence of other specified parts of digestive tract: Secondary | ICD-10-CM | POA: Diagnosis present

## 2015-04-06 DIAGNOSIS — E46 Unspecified protein-calorie malnutrition: Secondary | ICD-10-CM | POA: Diagnosis present

## 2015-04-06 DIAGNOSIS — F0390 Unspecified dementia without behavioral disturbance: Secondary | ICD-10-CM | POA: Diagnosis present

## 2015-04-06 DIAGNOSIS — Z79899 Other long term (current) drug therapy: Secondary | ICD-10-CM | POA: Diagnosis not present

## 2015-04-06 DIAGNOSIS — D649 Anemia, unspecified: Secondary | ICD-10-CM | POA: Diagnosis present

## 2015-04-06 DIAGNOSIS — Z87891 Personal history of nicotine dependence: Secondary | ICD-10-CM | POA: Diagnosis not present

## 2015-04-06 DIAGNOSIS — B961 Klebsiella pneumoniae [K. pneumoniae] as the cause of diseases classified elsewhere: Secondary | ICD-10-CM | POA: Diagnosis present

## 2015-04-06 DIAGNOSIS — Z9181 History of falling: Secondary | ICD-10-CM

## 2015-04-06 DIAGNOSIS — Z6822 Body mass index (BMI) 22.0-22.9, adult: Secondary | ICD-10-CM

## 2015-04-06 DIAGNOSIS — N390 Urinary tract infection, site not specified: Secondary | ICD-10-CM | POA: Diagnosis present

## 2015-04-06 DIAGNOSIS — Z515 Encounter for palliative care: Secondary | ICD-10-CM | POA: Diagnosis not present

## 2015-04-06 DIAGNOSIS — I129 Hypertensive chronic kidney disease with stage 1 through stage 4 chronic kidney disease, or unspecified chronic kidney disease: Secondary | ICD-10-CM | POA: Diagnosis present

## 2015-04-06 DIAGNOSIS — L03211 Cellulitis of face: Secondary | ICD-10-CM | POA: Diagnosis present

## 2015-04-06 DIAGNOSIS — I482 Chronic atrial fibrillation: Secondary | ICD-10-CM | POA: Diagnosis present

## 2015-04-06 DIAGNOSIS — E440 Moderate protein-calorie malnutrition: Secondary | ICD-10-CM | POA: Diagnosis not present

## 2015-04-06 DIAGNOSIS — L03221 Cellulitis of neck: Secondary | ICD-10-CM | POA: Diagnosis present

## 2015-04-06 DIAGNOSIS — K219 Gastro-esophageal reflux disease without esophagitis: Secondary | ICD-10-CM | POA: Diagnosis present

## 2015-04-06 DIAGNOSIS — K113 Abscess of salivary gland: Secondary | ICD-10-CM | POA: Diagnosis present

## 2015-04-06 DIAGNOSIS — E860 Dehydration: Secondary | ICD-10-CM | POA: Diagnosis present

## 2015-04-06 DIAGNOSIS — N189 Chronic kidney disease, unspecified: Secondary | ICD-10-CM | POA: Diagnosis present

## 2015-04-06 LAB — CBC WITH DIFFERENTIAL/PLATELET
BASOS PCT: 0.8 %
Basophil #: 0.2 10*3/uL — ABNORMAL HIGH (ref 0.0–0.1)
EOS PCT: 0.2 %
Eosinophil #: 0 10*3/uL (ref 0.0–0.7)
HCT: 35.7 % — ABNORMAL LOW (ref 40.0–52.0)
HGB: 11.7 g/dL — AB (ref 13.0–18.0)
Lymphocyte #: 1.7 10*3/uL (ref 1.0–3.6)
Lymphocyte %: 7.8 %
MCH: 27.8 pg (ref 26.0–34.0)
MCHC: 32.8 g/dL (ref 32.0–36.0)
MCV: 85 fL (ref 80–100)
MONO ABS: 0.5 x10 3/mm (ref 0.2–1.0)
MONOS PCT: 2.2 %
Neutrophil #: 19.5 10*3/uL — ABNORMAL HIGH (ref 1.4–6.5)
Neutrophil %: 89 %
PLATELETS: 272 10*3/uL (ref 150–440)
RBC: 4.21 10*6/uL — AB (ref 4.40–5.90)
RDW: 15.4 % — ABNORMAL HIGH (ref 11.5–14.5)
WBC: 21.9 10*3/uL — AB (ref 3.8–10.6)

## 2015-04-06 LAB — BASIC METABOLIC PANEL
ANION GAP: 7 (ref 7–16)
BUN: 23 mg/dL — ABNORMAL HIGH
CO2: 22 mmol/L
Calcium, Total: 7.5 mg/dL — ABNORMAL LOW
Chloride: 103 mmol/L
Creatinine: 1.76 mg/dL — ABNORMAL HIGH
EGFR (African American): 41 — ABNORMAL LOW
EGFR (Non-African Amer.): 35 — ABNORMAL LOW
Glucose: 103 mg/dL — ABNORMAL HIGH
Potassium: 4.2 mmol/L
SODIUM: 132 mmol/L — AB

## 2015-04-06 LAB — URINALYSIS, COMPLETE
BILIRUBIN, UR: NEGATIVE
Glucose,UR: NEGATIVE mg/dL (ref 0–75)
Ketone: NEGATIVE
Nitrite: NEGATIVE
Ph: 5 (ref 4.5–8.0)
Specific Gravity: 1.029 (ref 1.003–1.030)

## 2015-04-06 LAB — LACTIC ACID, PLASMA: LACTIC ACID, VENOUS: 1.7 mmol/L

## 2015-04-07 LAB — CBC WITH DIFFERENTIAL/PLATELET
BASOS ABS: 0.1 10*3/uL (ref 0.0–0.1)
Basophil %: 0.3 %
Eosinophil #: 0.1 10*3/uL (ref 0.0–0.7)
Eosinophil %: 0.8 %
HCT: 26.8 % — AB (ref 40.0–52.0)
HGB: 8.8 g/dL — ABNORMAL LOW (ref 13.0–18.0)
Lymphocyte #: 1.7 10*3/uL (ref 1.0–3.6)
Lymphocyte %: 9.4 %
MCH: 27.9 pg (ref 26.0–34.0)
MCHC: 32.8 g/dL (ref 32.0–36.0)
MCV: 85 fL (ref 80–100)
Monocyte #: 0.3 x10 3/mm (ref 0.2–1.0)
Monocyte %: 1.8 %
NEUTROS PCT: 87.7 %
Neutrophil #: 15.5 10*3/uL — ABNORMAL HIGH (ref 1.4–6.5)
Platelet: 199 10*3/uL (ref 150–440)
RBC: 3.14 10*6/uL — ABNORMAL LOW (ref 4.40–5.90)
RDW: 15.8 % — AB (ref 11.5–14.5)
WBC: 17.7 10*3/uL — ABNORMAL HIGH (ref 3.8–10.6)

## 2015-04-07 LAB — BASIC METABOLIC PANEL
Anion Gap: 8 (ref 7–16)
BUN: 23 mg/dL — ABNORMAL HIGH
CALCIUM: 6.7 mg/dL — AB
CHLORIDE: 104 mmol/L
CO2: 18 mmol/L — AB
Creatinine: 1.65 mg/dL — ABNORMAL HIGH
EGFR (African American): 44 — ABNORMAL LOW
EGFR (Non-African Amer.): 38 — ABNORMAL LOW
GLUCOSE: 80 mg/dL
Potassium: 4.3 mmol/L
SODIUM: 130 mmol/L — AB

## 2015-04-07 LAB — ALBUMIN: ALBUMIN: 1.2 g/dL — AB

## 2015-04-08 DIAGNOSIS — A419 Sepsis, unspecified organism: Secondary | ICD-10-CM | POA: Diagnosis present

## 2015-04-08 LAB — CBC WITH DIFFERENTIAL/PLATELET
BASOS ABS: 0.1 10*3/uL (ref 0.0–0.1)
BASOS PCT: 0.5 %
EOS ABS: 0.1 10*3/uL (ref 0.0–0.7)
Eosinophil %: 0.6 %
HCT: 28.6 % — ABNORMAL LOW (ref 40.0–52.0)
HGB: 9.5 g/dL — ABNORMAL LOW (ref 13.0–18.0)
LYMPHS ABS: 1.8 10*3/uL (ref 1.0–3.6)
Lymphocyte %: 10.2 %
MCH: 28.2 pg (ref 26.0–34.0)
MCHC: 33.1 g/dL (ref 32.0–36.0)
MCV: 85 fL (ref 80–100)
Monocyte #: 0.3 x10 3/mm (ref 0.2–1.0)
Monocyte %: 2 %
Neutrophil #: 15 10*3/uL — ABNORMAL HIGH (ref 1.4–6.5)
Neutrophil %: 86.7 %
PLATELETS: 239 10*3/uL (ref 150–440)
RBC: 3.36 10*6/uL — AB (ref 4.40–5.90)
RDW: 16 % — ABNORMAL HIGH (ref 11.5–14.5)
WBC: 17.3 10*3/uL — ABNORMAL HIGH (ref 3.8–10.6)

## 2015-04-08 LAB — BASIC METABOLIC PANEL
ANION GAP: 6 — AB (ref 7–16)
BUN: 22 mg/dL — ABNORMAL HIGH
Calcium, Total: 6.5 mg/dL — CL
Chloride: 107 mmol/L
Co2: 17 mmol/L — ABNORMAL LOW
Creatinine: 1.7 mg/dL — ABNORMAL HIGH
GFR CALC AF AMER: 42 — AB
GFR CALC NON AF AMER: 36 — AB
GLUCOSE: 84 mg/dL
POTASSIUM: 3.7 mmol/L
Sodium: 130 mmol/L — ABNORMAL LOW

## 2015-04-08 LAB — MAGNESIUM: Magnesium: 1.3 mg/dL — ABNORMAL LOW

## 2015-04-08 LAB — CULTURE, BLOOD (SINGLE)

## 2015-04-08 LAB — LACTIC ACID, PLASMA: Lactic Acid, Venous: 1.7 mmol/L

## 2015-04-08 MED ORDER — TRAZODONE HCL 50 MG PO TABS: 50.0000 mg | ORAL_TABLET | Freq: Every day | ORAL | Status: DC

## 2015-04-08 MED ORDER — HEPARIN SODIUM (PORCINE) 5000 UNIT/ML IJ SOLN
5000.0000 [IU] | Freq: Three times a day (TID) | INTRAMUSCULAR | Status: DC
  Administered 2015-04-09 – 2015-04-10 (×3): 5000 [IU] via SUBCUTANEOUS
  Filled 2015-04-08 (×3): qty 1

## 2015-04-08 MED ORDER — SIMVASTATIN 40 MG PO TABS: 40.0000 mg | ORAL_TABLET | Freq: Every day | ORAL | Status: DC

## 2015-04-08 MED ORDER — SENNA 8.6 MG PO TABS: 1.0000 | ORAL_TABLET | Freq: Two times a day (BID) | ORAL | Status: DC | PRN

## 2015-04-08 MED ORDER — VANCOMYCIN HCL IN DEXTROSE 1-5 GM/200ML-% IV SOLN: 1000.0000 mg | INTRAVENOUS | Status: DC

## 2015-04-08 MED ORDER — SODIUM CHLORIDE 0.9 % IJ SOLN: 3.0000 mL | INTRAMUSCULAR | Status: DC | PRN

## 2015-04-08 MED ORDER — AMIODARONE HCL 200 MG PO TABS: 200.0000 mg | ORAL_TABLET | Freq: Two times a day (BID) | ORAL | Status: DC

## 2015-04-08 MED ORDER — VANCOMYCIN HCL IN DEXTROSE 1-5 GM/200ML-% IV SOLN
1000.0000 mg | INTRAVENOUS | Status: DC
  Administered 2015-04-09: 1000 mg via INTRAVENOUS
  Filled 2015-04-08 (×2): qty 200

## 2015-04-08 MED ORDER — AMIODARONE HCL 200 MG PO TABS: 200.0000 mg | ORAL_TABLET | Freq: Every day | ORAL | Status: DC

## 2015-04-08 MED ORDER — SODIUM CHLORIDE 0.9 % IV SOLN
INTRAVENOUS | Status: DC
  Administered 2015-04-09 (×2): via INTRAVENOUS

## 2015-04-08 MED ORDER — MIRTAZAPINE 15 MG PO TABS: 7.5000 mg | ORAL_TABLET | Freq: Every day | ORAL | Status: DC

## 2015-04-08 MED ORDER — PANTOPRAZOLE SODIUM 40 MG PO TBEC
40.0000 mg | DELAYED_RELEASE_TABLET | Freq: Every day | ORAL | Status: DC
  Filled 2015-04-08: qty 1

## 2015-04-08 MED ORDER — ACETAMINOPHEN 325 MG PO TABS: 650.0000 mg | ORAL_TABLET | ORAL | Status: DC | PRN

## 2015-04-08 MED ORDER — SODIUM CHLORIDE 0.9 % IV SOLN
1.0000 g | Freq: Two times a day (BID) | INTRAVENOUS | Status: DC
  Filled 2015-04-08 (×2): qty 1

## 2015-04-08 MED ORDER — MELATONIN 3 MG PO TABS
3.0000 mg | ORAL_TABLET | Freq: Every day | ORAL | Status: DC
  Filled 2015-04-08 (×2): qty 1

## 2015-04-08 MED ORDER — TAMSULOSIN HCL 0.4 MG PO CAPS: 0.8000 mg | ORAL_CAPSULE | Freq: Every day | ORAL | Status: DC

## 2015-04-08 MED ORDER — DOCUSATE SODIUM 100 MG PO CAPS: 100.0000 mg | ORAL_CAPSULE | Freq: Two times a day (BID) | ORAL | Status: DC | PRN

## 2015-04-08 MED ORDER — AMIODARONE HCL 200 MG PO TABS
400.0000 mg | ORAL_TABLET | Freq: Two times a day (BID) | ORAL | Status: DC
  Administered 2015-04-10: 400 mg via ORAL
  Filled 2015-04-08: qty 2

## 2015-04-08 MED ORDER — SODIUM CHLORIDE 0.9 % IV SOLN: 1.0000 g | Freq: Two times a day (BID) | INTRAVENOUS | Status: DC

## 2015-04-09 LAB — CBC
HEMATOCRIT: 18.5 % — AB (ref 40.0–52.0)
Hemoglobin: 6 g/dL — ABNORMAL LOW (ref 13.0–18.0)
MCH: 27.8 pg (ref 26.0–34.0)
MCHC: 32.7 g/dL (ref 32.0–36.0)
MCV: 85 fL (ref 80.0–100.0)
PLATELETS: 134 10*3/uL — AB (ref 150–440)
RBC: 2.17 MIL/uL — ABNORMAL LOW (ref 4.40–5.90)
RDW: 16.2 % — ABNORMAL HIGH (ref 11.5–14.5)
WBC: 16.3 10*3/uL — ABNORMAL HIGH (ref 3.8–10.6)

## 2015-04-09 LAB — ABO/RH: ABO/RH(D): O POS

## 2015-04-09 MED ORDER — FUROSEMIDE 10 MG/ML IJ SOLN
20.0000 mg | Freq: Once | INTRAMUSCULAR | Status: AC
  Administered 2015-04-09: 20 mg via INTRAVENOUS
  Filled 2015-04-09: qty 2

## 2015-04-09 MED ORDER — SODIUM CHLORIDE 0.9 % IV SOLN
INTRAVENOUS | Status: DC
  Administered 2015-04-10: 03:00:00 via INTRAVENOUS

## 2015-04-09 MED ORDER — SODIUM CHLORIDE 0.9 % IV SOLN
1.0000 g | Freq: Two times a day (BID) | INTRAVENOUS | Status: DC
  Administered 2015-04-09 – 2015-04-10 (×4): 1 g via INTRAVENOUS
  Filled 2015-04-09 (×5): qty 1

## 2015-04-09 MED ORDER — ACETAMINOPHEN 325 MG PO TABS
650.0000 mg | ORAL_TABLET | Freq: Once | ORAL | Status: AC
  Administered 2015-04-09: 650 mg via ORAL
  Filled 2015-04-09: qty 2

## 2015-04-09 NOTE — Consult Note (Signed)
   Comments   I spoke with pt's wife who is coming to hospital. Unable to get pt's daughters on phone was I was able to get his son[place] Updated son on pt's critical condition. He will notify his sisters. We meet with wife on arrival to hospital.   Electronic Signatures: Layza Summa, Harriett SineNancy (MD)  (Signed 12-Feb-16 13:28)  Authored: Palliative Care   Last Updated: 12-Feb-16 13:28 by Siniya Lichty, Harriett SineNancy (MD)

## 2015-04-09 NOTE — Consult Note (Signed)
EGD with retained fluid in stomach, poor gastric motility.  Will start iv reglan 5mg  qid iv to see if can improve gastric emptying and therefore his esophagitis.  Discussed with daughter.  Electronic Signatures: Scot JunElliott, Lacrisha Bielicki T (MD)  (Signed on 15-Feb-16 16:24)  Authored  Last Updated: 15-Feb-16 16:24 by Scot JunElliott, Shandricka Monroy T (MD)

## 2015-04-09 NOTE — Progress Notes (Signed)
Pt A&O, but has some mild confusion at times.  VSS. SR to ST on tele.  Pt continues to have rhonci throughout lungs.  No dyspnea at rest.  Pt continues to have edema generalized.  No complaints of pain.  Pt rested on and off throughout shift.  Cristela FeltHelen Iris Guidry, RN

## 2015-04-09 NOTE — Consult Note (Signed)
Chief Complaint:  Subjective/Chief Complaint Sleepy this AM. NO GI complaints. NO nausea, diarrhea, or abdominal pain.   VITAL SIGNS/ANCILLARY NOTES: **Vital Signs.:   21-Feb-16 05:38  Vital Signs Type Routine  Temperature Temperature (F) 97.6  Celsius 36.4  Pulse Pulse 99  Respirations Respirations 18  Systolic BP Systolic BP 217  Diastolic BP (mmHg) Diastolic BP (mmHg) 70  Mean BP 83  Pulse Ox % Pulse Ox % 96  Pulse Ox Activity Level  At rest  Oxygen Delivery 1L   Brief Assessment:  GEN no acute distress   Cardiac Regular   Respiratory clear BS   Gastrointestinal Normal   Lab Results: Routine Chem:  20-Feb-16 05:35   Glucose, Serum 89  BUN 13  Creatinine (comp)  1.89  Sodium, Serum 142  Potassium, Serum 4.2  Chloride, Serum  111  CO2, Serum 24  Calcium (Total), Serum  7.6  Anion Gap 7  Osmolality (calc) 283  eGFR (African American)  44  eGFR (Non-African American)  36 (eGFR values <39m/min/1.73 m2 may be an indication of chronic kidney disease (CKD). Calculated eGFR, using the MRDR Study equation, is useful in  patients with stable renal function. The eGFR calculation will not be reliable in acutely ill patients when serum creatinine is changing rapidly. It is not useful in patients on dialysis. The eGFR calculation may not be applicable to patients at the low and high extremes of body sizes, pregnant women, and vegetarians.)   Assessment/Plan:  Assessment/Plan:  Assessment Gastroparesis, reflux esophagitis. Doing better.   Plan Continue protonix bid. Hold off on resuming reglan since patient tolerating diet. Dr. EVira Agarto resume care tomorrow. Thanks.   Electronic Signatures: OVerdie Shire(MD)  (Signed 21-Feb-16 10:35)  Authored: Chief Complaint, VITAL SIGNS/ANCILLARY NOTES, Brief Assessment, Lab Results, Assessment/Plan   Last Updated: 21-Feb-16 10:35 by OVerdie Shire(MD)

## 2015-04-09 NOTE — Discharge Summary (Signed)
PATIENT NAME:  Marcus Gates, Marcus Gates MR#:  [id number] DATE OF BIRTH:  Apr 14, 1930  DATE OF ADMISSION:  01/18/2015 DATE OF DISCHARGE:  02/01/2015  CONSULTING PHYSICIANS:  Include Dr. Delia HeadyFife with Palliative Care, Dr. Mechele CollinElliott with GI, and [facility].   DISCHARGE DIAGNOSES: 1.  Gastrointestinal bleed due to esophagitis.  2.  Gastroparesis.  3.  Failure to thrive and weight loss.  4.  Dementia.  5.  Acute blood loss anemia.  6.  Acute-on-chronic kidney disease.  7.  Hypertension.  8.  Atrial fibrillation.  HISTORY OF PRESENT ILLNESS:  Please see initial history and physical for details. Briefly, this is an 81 year old gentleman who had been losing weight and having failure to thrive as an outpatient. He had had a recent admission.   HOSPITAL COURSE BY ISSUE:   1.  Gastroenterology. The patient was found to have grade D esophagitis on EGD and also felt likely to have gastroparesis. He was followed closely by GI. He was started on Reglan but developed diarrhea associated with this, so this was discontinued. He did have a calorie count and was found to have very poor nutrition at less than 500 calories a day.  2.  Acute blood loss anemia likely due to esophagitis. The patient is status post transfusion of 1 unit packed red blood cells, and his hemoglobin remained stable. He was treated with PPI.  3.  Hypertension, atrial fibrillation. The patient was on metoprolol. He was followed closely by cardiology.  4.  Weight loss. There was concern for malignancy. He did have an MRI, which was a poor study due to motion artifact, but there was no evidence of underlying malignancy. It was felt gastroparesis may be contributing to the cause as well as his progressive dementia. The patient was seen by palliative care.  5.  Severe malnutrition. The patient continued to decline and has been for several months. On calorie counts, he was taking less than 500 calories a day. He was also noted to develop ascites and pleural  effusions from hypoalbuminemia. He had chronic diarrhea. He was really unable to mobilize himself or increase his nutrition. The patient kept asking to be discharged home. His case was complicated by his advanced dementia. His wife also has dementia. The patient had extensive evaluation and discussions with palliative care as well as with myself. Plan was to discharge home with home health, physical therapy, occupational therapy, and social work.   CONDITION ON DISCHARGE:  Poor.   CODE STATUS:  Full code.   DISCHARGE MEDICATIONS:  [facility].   DISCHARGE FOLLOWUP:  The patient will follow up with Dr. Dan HumphreysWalker within 1 to 2 weeks.   TIME SPENT: This discharge took 40 minutes.    ____________________________ Stann Mainlandavid P. Sampson GoonFitzgerald, MD dpf:nb D: 02/14/2015 20:01:28 ET T: 02/15/2015 02:24:43 ET JOB#: [id number]  cc: Stann Mainlandavid P. Sampson GoonFitzgerald, MD, <Dictator> Falicity Sheets Sampson GoonFITZGERALD MD ELECTRONICALLY SIGNED 02/15/2015 7:12

## 2015-04-09 NOTE — Consult Note (Signed)
General Aspect Primary Cardiologist: New to [facility] ___________  81 year old Gates with history of HTN, HLD, anemia, urinary rentention, failure to thrive, recent cellulitis and sepsis of the right upper extremity, debility, and dementia who was recently admitted to [facility] 1/27-2/4 for RUE cellulitis, bacteremia and falls. He was discharged to [facility]. He returned to [facility] on 2/10 complaining of chest pain. Cardiology was consulted for further input.  __________  PMH: 1. HTN 2. HLD 3. Anemia 4. Urinary rentention 5. Failure to thrive 6. Recent cellulitis and sepsis of the right upper extremity 7. Debility 8. Dementia __________   Present Illness 81 year old Gates with the above problem list who presented to [facility] after his recent admission at the end of January with chest pain. He denies any previously known cardiac history. Review of records from [place], [place], and Care Everywhere supports that he has not seen a cardiologist previously. 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 During that hospitalization he was followed by the palliative care team. He was discharged to [facility].   On 2/10 he developed chest pain. He thought this was reflux and began to take mineral oil without aid in symptoms. He was brought to [facility] for further evaluation. Upon his arrival he was noted to have negative troponin x 2. EKG c/w a-fib with RVR, rate 123, low voltage. He remained in a-fib with RVR until this morning when he converted back to NSR with HR in the 90s. He was started on Eliquis 10 mg bid by primary team for possible PE. Blood culture + x 1 GPC, ID to follow. He has continued to state he is in pain, though he is unable to state where. He does currently deny any chest pain. He was givenSL NTG x 2 and dilaudid with pain down to 5/10 then vomited with some  relief this morning. Blood pressures have been soft. Currently waiting on his wife to arrive at the hospital for long term treatment goals with palliative care.   Physical Exam:  GEN critically ill appearing   HEENT hearing intact to voice   NECK supple   RESP postive use of accessory muscles  course   CARD Regular rate and rhythm  No murmur   ABD denies tenderness  soft   EXTR negative edema   SKIN RUQ cellulitis   NEURO cranial nerves intact   PSYCH alert   Review of Systems:  ROS Pt not able to provide ROS  groans 2/2 pain   Medications/Allergies Reviewed Medications/Allergies reviewed   Family & Social History:  Family and Social History:  Social History negative ETOH, negative Illicit drugs   + Tobacco Prior (greater than 1 year)   Place of Living [place]     gerd:    high cholesterol:    HTN:    hernia:    surgery:   Home Medications: Medication Instructions Status  Ensure Plus * 237 milliliter(s) orally 3 times a day (with meals) Active  acetaminophen 325 mg oral tablet 2 tab(s) orally every 4 hours, As needed, mild pain (1-3/10) or temp. greater than 100.4 Active  metoprolol 100 mg oral tablet 1 tab(s) orally once a day Active  Vitamin D3 1000 intl units oral tablet 1 tab(s) orally 2 times a day Active  omeprazole 20 mg oral delayed release capsule 1 cap(s)  orally once a day (in the morning) Active  mirtazapine 15 mg oral tablet 1 tab(s) orally once a day (at bedtime) Active  tamsulosin 0.4 mg oral capsule 2 cap(s) orally once a day (at bedtime) Active   Lab Results:  Thyroid:  11-Feb-16 03:59   Thyroid Stimulating Hormone  7.Marcus (0.45-4.50 (IU = International Unit)  ----------------------- Pregnant patients have  different reference  ranges for TSH:  - - - - - - - - - -  Pregnant, first trimetser:  0.36 - 2.50 uIU/mL)  Routine Micro:  10-Feb-16 22:40   Micro Text Report BLOOD CULTURE   COMMENT                   NO  GROWTH IN 18-24 HOURS   ANTIBIOTIC                       11-Feb-16 00:05   Micro Text Report BLOOD CULTURE   COMMENT                   IN AEROBIC AND ANAEROBIC BOTTLES   COMMENT                   ID TO FOLLOW   GRAM STAIN                GRAM POSITIVE COCCI   GRAM STAIN                GRAM POSITIVE COCCI   ANTIBIOTIC                       Gram Stain 1 GRAM POSITIVE COCCI  Gram Stain 2 GRAM POSITIVE COCCI  Routine Chem:  10-Feb-16 22:40   BUN  21  Creatinine (comp)  2.09  Potassium, Serum 3.7  12-Feb-16 03:13   Glucose, Serum 74  BUN  20  Creatinine (comp)  1.80  Sodium, Serum 141  Potassium, Serum 3.7  Chloride, Serum  112  CO2, Serum 21  Calcium (Total), Serum  7.4  Anion Gap 8  Osmolality (calc) 283  eGFR (African American)  47  eGFR (Non-African American)  39 (eGFR values <57m/min/1.Marcus m2 may be an indication of chronic kidney disease (CKD). Calculated eGFR, using the MRDR Study equation, is useful in  patients with stable renal function. The eGFR calculation will not be reliable in acutely ill patients when serum creatinine is changing rapidly. It is not useful in patients on dialysis. The eGFR calculation may not be applicable to patients at the low and high extremes of body sizes, pregnant women, and vegetarians.)  Cardiac:  10-Feb-16 22:40   Troponin I < 0.02 (0.00-0.05 0.05 ng/mL or less: NEGATIVE  Repeat testing in 3-6 hrs  if clinically indicated. >0.05 ng/mL: POTENTIAL  MYOCARDIAL INJURY. Repeat  testing in 3-6 hrs if  clinically indicated. NOTE: An increase or decrease  of 30% or more on serial  testing suggests a  clinically important change)  Routine Hem:  10-Feb-16 22:40   Hemoglobin (CBC)  9.6  WBC (CBC) 8.0  Hematocrit (CBC)  28.9  Platelet Count (CBC) 273 (Result(s) reported on 18 Jan 2015 at 11:08PM.)  12-Feb-16 03:13   Hemoglobin (CBC)  8.8  WBC (CBC) 6.1  RBC (CBC)  2.85  Hematocrit (CBC)  27.1  Platelet Count (CBC) 257  MCV 95   MCH 31.0  MCHC 32.6  RDW  14.7  Neutrophil % 65.0  Lymphocyte % 22.0  Monocyte % 7.2  Eosinophil % 5.2  Basophil % 0.6  Neutrophil # 4.0  Lymphocyte # 1.3  Monocyte # 0.4  Eosinophil # 0.3  Basophil # 0.0 (Result(s) reported on 20 Jan 2015 at 03:57AM.)   EKG:  EKG Interp. by me   Interpretation a-fib with RVR,123 bpm, nonspecific st/t changes   Radiology Results: XRay:    10-Feb-16 23:02, Chest Portable Single View  Chest Portable Single View   REASON FOR EXAM:    Chest Pain  COMMENTS:       PROCEDURE: DXR - DXR PORTABLE CHEST SINGLE VIEW  - Jan 18 2015 11:02PM     CLINICAL DATA:  Chest pain for 2-3 days. History of PICC line  infection, on antibiotics.    EXAM:  PORTABLE CHEST - 1 VIEW    COMPARISON:  Chest radiograph January 05, 2015    FINDINGS:  Cardiac silhouette is unremarkable. Mildly calcified aortic knob.  Bibasilar strandy densities. Small LEFT pleural effusion suspected  in this low inspiratory examination. No pneumothorax. Interval  removal of a LEFT PICC line, no radiographic findings of retained  catheter fragments. Moderate degenerative change of the thoracic  spine. Soft tissue planes are nonsuspicious.     IMPRESSION:  Bibasilar presumed atelectasis and small suspected LEFT pleural  effusion in this low inspiratory portable examination.      Electronically Signed    By: Elon Alas    On: 01/18/2015 23:13     Verified By: Ricky Ala, M.D.,  [place]:    11-Feb-16 18:19, [place] Color Doppler Upper Extrem Bilat (Arms)  [place] Color Doppler Upper Extrem Bilat (Arms)   REASON FOR EXAM:    Edema  COMMENTS:       PROCEDURE: [place]  - US DOPPLER UP EXTR  BILATERAL  - Jan 19 2015  6:19PM     CLINICAL DATA:  81 year old Gates with bilateral upper extremity  edema versus cellulitis.    EXAM:  BILATERAL UPPER EXTREMITY VENOUS DOPPLER ULTRASOUND    TECHNIQUE:  Gray-scale sonography with graded compression, as well as color  Doppler and duplex  ultrasound were performed to evaluate the  bilateral upper extremity deep venous systems from the level of the  subclavian vein and including the jugular, axillary, basilic,  radial, ulnar and upper cephalic vein. Spectral Doppler was utilized  to evaluate flow at rest and with distal augmentation maneuvers.    COMPARISON:  None.    FINDINGS:  RIGHT UPPER EXTREMITY    Internal Jugular Vein: No evidence of thrombus. Normal  compressibility, respiratory phasicity and response to augmentation.    Subclavian Vein: No evidence of thrombus. Normal compressibility,  respiratory phasicity and response to augmentation.  Axillary Vein: No evidence of thrombus. Normal compressibility,  respiratory phasicity and response to augmentation.    Cephalic Vein: No evidence of thrombus. Normal compressibility,  respiratory phasicity and response to augmentation.    Basilic Vein: No evidence of thrombus. Normal compressibility,  respiratory phasicity and response to augmentation.    Brachial Veins: No evidence of thrombus. Normal compressibility,  respiratory phasicity and response to augmentation.    Radial Veins: No evidence of thrombus.Normal compressibility,  respiratory phasicity and response to augmentation.  Ulnar Veins: No evidence of thrombus. Normal compressibility,  respiratory phasicity and response to augmentation.    Venous Reflux:  None.    Other Findings:  None.    LEFT UPPER EXTREMITY    Internal Jugular Vein: No evidence of thrombus. Normal  compressibility, respiratory phasicity  and response to augmentation.    Subclavian Vein: No evidence of thrombus. Normal compressibility,  respiratory phasicity and response to augmentation.  Axillary Vein: No evidence of thrombus. Normal compressibility,  respiratory phasicity and response to augmentation.    Cephalic Vein: No evidence of thrombus. Normal compressibility,  respiratory phasicity and response to  augmentation.    Basilic Vein: No evidence of thrombus. Normal compressibility,  respiratory phasicity and response to augmentation.    Brachial Veins: No evidence of thrombus. Normal compressibility,  respiratory phasicity and response to augmentation.    Radial Veins: No evidence of thrombus. Normal compressibility,  respiratory phasicity and response to augmentation.  Ulnar Veins: No evidence of thrombus. Normal compressibility,  respiratory phasicity and response to augmentation.    Venous Reflux:  None.    Other Findings:  None.     IMPRESSION:  No evidence of deep venous thrombosis.      Electronically Signed    By: Jacqulynn Cadet M.D.    On: 01/20/2015 07:38     Verified By: Criselda Peaches, M.D.,    Morphine: Other  Rocephin: Other  Vital Signs/Nurse's Notes: **Vital Signs.:   12-Feb-16 09:35  Vital Signs Type Admission  Temperature Temperature (F) 97.4  Celsius 36.3  Temperature Source oral  Pulse Pulse 101  Respirations Respirations 18  Systolic BP Systolic BP 553  Diastolic BP (mmHg) Diastolic BP (mmHg) 67  Mean BP 80  BP Source  if not from Vital Sign Device non-invasive  Pulse Ox % Pulse Ox % 100  Pulse Ox Activity Level  At rest  Oxygen Delivery 5L    Impression 81 year old Gates with history of HTN, HLD, anemia, urinary rentention, failure to thrive, recent cellulitis and sepsis of the right upper extremity, debility, and dementia who was recently admitted to [facility] 1/27-2/4 for RUE cellulitis, bacteremia and falls. He was discharged to [facility]. He returned to [facility] on 2/10 complaining of chest pain. Cardiology was consulted for further input.   1. Sepsis: -Poor prognosis, hypotensive - awaiting wife to arrive this AM for discussion of treatment goals  -Blood culture x 1 growing GPC (question pathogen vs skin) -ID on board and following   2. Chest pain: -Currently denying. ecg without ischemic changes and TnI negative.  -Not a candidate  for ischemic evaluation at this time -Would pursue primary prevention if wife wishes to continue treatment  3. New onset a-fib: -Likely in the setting of his sepsis -Currently in NSR, blood pressure limiting rate control agents, renal function elevated at 1.80  - monitor  - No anticoagulation due to vomitting blood.  -Recent echo during last admission showed normal LV function, LA 4.2 cm  4. Dementia: -Discuss treatment goals with wife  5. Longterm prognosis is poor   Electronic Signatures: Kathlyn Sacramento (MD)  (Signed 12-Feb-16 17:41)  Authored: Impression/Plan  Co-Signer: General Aspect/Present Illness, History and Physical Exam, Review of System, Family & Social History, Past Medical History, Home Medications, Labs, EKG , Radiology, Allergies, Vital Signs/Nurse's Notes, Impression/Plan Rise Mu (PA-C)  (Signed 12-Feb-16 10:54)  Authored: General Aspect/Present Illness, History and Physical Exam, Review of System, Family & Social History, Past Medical History, Home Medications, Labs, EKG , Radiology, Allergies, Vital Signs/Nurse's Notes, Impression/Plan   Last Updated: 12-Feb-16 17:41 by Kathlyn Sacramento (MD)

## 2015-04-09 NOTE — Consult Note (Signed)
   Comments   Goals of care meeting had with pt's wife and pt.  Wife states at home she has noticed a decline in pt's functionability.  Over the last 2 months pt has lost 30lbs due to "not wanting to eat".  Pt is falling more often per wife and is mainly in the bed and wanting to sleep.  Pt needs help intermittently with bathing and dressing self.  Spoke with pt and wife about hospice services and they both agree.  Pt states he wants to be a Full Code but would not want to live on machines.  Wife acknowledges this and both did not want to speak about this further.  hospice screen ordered. Calorie malnutritionunintentional weight loss (30lbs in 2 months) 40%  Electronic Signatures: Reather LaurenceMantzouris, Chadric Kimberley J (NP)  (Signed 01-Feb-16 12:59)  Authored: Palliative Care   Last Updated: 01-Feb-16 12:59 by Reather LaurenceMantzouris, Laurelle Skiver J (NP)

## 2015-04-09 NOTE — Consult Note (Signed)
Pt with diarrhea 4 times today.  Possibly could be effect of Reglan.  Will stop this for now and see if diarrhea improves.  If it does then may restart it at a lower dose.  Abd not tender, bowel sounds tinkling a little.  Pt appetite down some. Chest clear in anterior fields.  Electronic Signatures: Scot JunElliott, Camryn Lampson T (MD)  (Signed on 19-Feb-16 17:37)  Authored  Last Updated: 19-Feb-16 17:37 by Scot JunElliott, Gaylen Venning T (MD)

## 2015-04-09 NOTE — H&P (Signed)
PATIENT NAME:  Marcus Gates, Marcus Gates MR#:  [id number] DATE OF BIRTH:  10/30/1930  DATE OF ADMISSION:  01/18/2015  ADMISSION DIAGNOSIS:  Sepsis and left upper extremity cellulitis as well as tachycardia.   REFERRING PHYSICIAN:  Kittrell Sink. Dolores Frame, MD   PRIMARY CARE PHYSICIAN:  Junious Silk, MD  HISTORY OF PRESENT ILLNESS:  This is an 81 year old Caucasian male who presents to the Emergency Department via EMS from his nursing home after complaining of chest pain. The pain began as the patient tried to swallow some mineral oil containing solution presumably for reflux. Upon arrival to the Emergency Department, the patient was no longer in chest pain. He denies any shortness of breath, nausea, vomiting. or diaphoresis. In the Emergency Department, the patient's EKG showed sinus tachycardia with a heart rate ranging from 120 beats per minutes to 160. He was also found to have some areas of redness of his left upper extremity. 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 The patient denies any fevers. He admits to decreased appetite, which has been an ongoing problem. He also denies pain anywhere. Due to his persistent tachycardia and possible sepsis, the Emergency Department called for admission.   REVIEW OF SYSTEMS:   CONSTITUTIONAL:  The patient denies fever, but admits to generalized weakness.  EYES:  Denies blurred vision or inflammation.  EARS, NOSE, AND THROAT:  Denies tinnitus or sore throat.  RESPIRATORY:  Denies cough or shortness of breath.  CARDIOVASCULAR:  Denies chest pain at this time, orthopnea, paroxysmal nocturnal dyspnea, or palpitations.  GASTROINTESTINAL:  Denies nausea, vomiting, diarrhea, or abdominal pain.  GENITOURINARY:  Denies dysuria or increased frequency or hesitancy of urination.  ENDOCRINE:  Denies polyuria or polydipsia.  HEMATOLOGIC  AND LYMPHATIC:  Admits to easy bruising, but denies bleeding.  INTEGUMENTARY:  Denies rashes or any new lesions aside from the healing cellulitis of his right upper extremity.  MUSCULOSKELETAL:  Denies arthralgias or myalgias.  NEUROLOGIC:  Denies numbness in his extremities or dysarthria.  PSYCHIATRIC:  Denies depression or suicidal ideation.   PAST MEDICAL HISTORY:  Hypertension, hyperlipidemia, anemia, urinary retention, failure to thrive, recent cellulitis and sepsis of the right upper extremity, debility, and dementia.   PAST SURGICAL HISTORY:  The patient has had a colon resection.   SOCIAL HISTORY:  He is a former smoker. He occasionally has a drink of alcohol and denies any drug use. He lives in [facility] assisted [facility] facility.   FAMILY HISTORY:  His father is deceased of a cerebral hemorrhage, and his mother is deceased of a cerebrovascular accident.   MEDICATIONS: 1.  Acetaminophen 325 mg 2 tablets p.o. every 4 hours as needed for fever or mild pain.  2.  Ensure Plus 237 mL one can 3 times a day with meals.  3.  Metoprolol succinate 100 mg extended release 1 tablet p.o. daily.  4.  Mirtazapine 15 mg 1 tablet p.o. at bedtime.  5.  Omeprazole 20 mg 1 capsule p.o. daily.  6.  Tamsulosin 0.4 mg 2 capsules p.o. at bedtime.  7.  Vitamin D3, 1000 international units 1 tablet p.o. b.i.d.   ALLERGIES:  MORPHINE AND ROCEPHIN.   PERTINENT LABORATORY RESULTS AND RADIOGRAPHIC FINDINGS:  Serum glucose is 117, BUN 21, creatinine 2.09, serum sodium 140, potassium 3.7, chloride 111, bicarbonate 22, calcium is 7.5, serum albumin is 1.2, alkaline phosphatase 128, AST 49,  ALT 24. Troponin is negative. White blood cell count is 8, hemoglobin 9.6, hematocrit 28.9, platelet count 273,000. MCV is 94. INR is 1.1. Chest x-ray shows bibasilar atelectasis, small suspected left pleural effusion, and low inspiratory effort film.   PHYSICAL EXAMINATION: VITAL SIGNS:  Temperature is 98.2, pulse 126,  respirations 20, blood pressure 100/75, pulse oximetry is 96% on room air.  GENERAL:  The patient is alert and oriented x 3. He is in no apparent distress.  HEENT:  Normocephalic, atraumatic. Pupils are equal, round, and reactive to light and accommodation. Extraocular movements are intact. Mucous membranes are moist.  NECK:  Trachea is midline. No adenopathy. Thyroid is nonpalpable and nontender.  CHEST:  Symmetric and atraumatic.  CARDIOVASCULAR:  Tachycardic rate, normal rhythm normal. S1 and S2. No rubs, clicks, or murmurs appreciated.  LUNGS:  Clear to auscultation bilaterally. Normal effort and excursion.  ABDOMEN:  Positive bowel sounds. Soft, nontender, nondistended. No hepatosplenomegaly. There is a reducible ventral hernia in the midline.  GENITOURINARY:  Deferred.  MUSCULOSKELETAL:  The patient moves all 4 extremities equally. I have not walked the patient nor tested his strength in his lower extremities. He is generally not ambulatory, although he tries while with physical therapy at times.  SKIN:  Warm and dry. There are no rashes, but the patient does have bilateral upper extremity erythema and excoriations of his ventral forearms.  EXTREMITIES:  No clubbing or cyanosis. The patient does have 1+ pitting edema of his extremities to his knees bilaterally.  NEUROLOGIC:  Cranial nerves II through XII are grossly intact.  PSYCHIATRIC:  Mood is normal. Affect is congruent. The patient has some insight into his medical condition. It is difficult to assess his judgment, although he seems to understand the nature of his hospitalization.   ASSESSMENT AND PLAN:  This is an 81 year old male admitted for sepsis and cellulitis.   1.  Sepsis. The patient meets criteria via tachycardia and tachypnea. He is normotensive at this time, and he is mentating well. Blood cultures have been obtained in the Emergency Department, and he was started on ciprofloxacin due to sensitivities from a previous blood  culture showing gram-negative sepsis in January. I will continue his antibiotics and monitor him on telemetry.  2.  Cellulitis. This hospitalization, his left upper arm is affected. The patient admits to scratching at his skin. As mentioned above, he does not appear to have any rashes. His pruritus may be a symptom of dementia. I have added Benadryl as needed for symptomatic relief.  3.  Hypertension. The patient is normotensive. He is on a long-acting dose of metoprolol 100 mg, but we may need to increase this dose if fluid resuscitation and antibiotics do not improve his tachycardia associated with sepsis.  4.  Tachycardia. We are at this time treating for presumed pulmonary embolism, as the patient is a high-risk candidate for deep vein thrombosis. He has failure to thrive and a history of colon resection, but has declined followup colonoscopies. His combination of anemia and malnutrition raise concern for underlying gastrointestinal malignancy. Thus, I have started Eliquis.  5.  Hyperlipidemia. The patient's statin therapy was stopped last admission.  6.  Urinary retention. We will continue tamsulosin.  7.  Failure to thrive. Continue Remeron and meal supplements for calorie supplementation.  8.  Dementia.  We will consider adding Aricept if the patient's memory appears to be severely impaired.  9.  Deep vein thrombosis prophylaxis. The patient is on treatment doses of new oral anticoagulant.  10.  Gastrointestinal prophylaxis, unnecessary as the patient is not critically ill at this time and has no history of peptic ulcer disease.   CODE STATUS:  The patient is a full code (he has declined decreasing his code status which is well documented from a palliative care consult his last hospital admission).   TIME SPENT ON ADMISSION ORDERS AND PATIENT CARE:  Approximately 40 minutes.    ____________________________ Kelton PillarMichael S. Sheryle Hailiamond, MD msd:nb D: 01/19/2015 00:59:25 ET T: 01/19/2015 04:01:48  ET JOB#: [id number]  cc: Kelton PillarMichael S. Sheryle Hailiamond, MD, <Dictator> Kelton PillarMICHAEL S Reeves Musick MD ELECTRONICALLY SIGNED 01/20/2015 0:25

## 2015-04-09 NOTE — Consult Note (Signed)
   Comments   I met with pt's wife. Updated her on pt's condition. She says that she understands that pt is critically ill. Her daughters are en route to hospital as well. I discussed pt's code status with wife. She is in agreement with DNR. Order entered.   Electronic Signatures: Donnie Panik, Izora Gala (MD)  (Signed 12-Feb-16 13:44)  Authored: Palliative Care   Last Updated: 12-Feb-16 13:44 by Donney Caraveo, Izora Gala (MD)

## 2015-04-09 NOTE — Consult Note (Signed)
Given his EGD findings he can go off iv infusion of Protonix and Octreotide and i will order this.  He should stay on iv bid Protonix for  a few more days.  He is tired of being in hospital and wants to go home but he has not ambulated in several days and will need PT consult.  IV reglan started and if he tolerates this he should probably go home on Reglan tid and hour before meals and at bedtime.  Full liq diet and advance to mechanical soft/ low residue.  Electronic Signatures: Scot JunElliott, Shayn Madole T (MD)  (Signed on 16-Feb-16 10:56)  Authored  Last Updated: 16-Feb-16 10:56 by Scot JunElliott, Junah Yam T (MD)

## 2015-04-09 NOTE — Consult Note (Signed)
Pt seems to be improving with less tenderness, still with impressive periumbilical hernia.  Chest clear anterior fields.  Eating more, not enough energy to ambulate with assistence per PT.  On iv reglan and will switch to oral tomorrow.  Electronic Signatures: Scot JunElliott, Gesenia Bantz T (MD)  (Signed on 17-Feb-16 14:50)  Authored  Last Updated: 17-Feb-16 14:50 by Scot JunElliott, Vasily Fedewa T (MD)

## 2015-04-09 NOTE — Discharge Summary (Signed)
PATIENT NAME:  Marcus Gates, Marcus Gates MR#:  [id number] DATE OF BIRTH:  January 24, 1930  DATE OF ADMISSION:  02/23/2015 DATE OF DISCHARGE:  02/24/2015  DISCHARGE DIAGNOSES:  1.  Chronic diarrhea, dehydration causing acute on chronic renal failure, possible gastrointestinal malignancy.  2.  Hypertension and atrial fibrillation.  3.  Dementia. 4.  Malnutrition.   CONDITION ON DISCHARGE: Stable.   MEDICATIONS ON DISCHARGE:  1.  Cardizem 120 mg extended release capsule once a day.  2.  Pantoprazole 40 mg delayed release 2 times a day.  3.  Flomax 0.4 mg oral capsule once a day.  4.  Vitamin D3 2000 international units once a day.  5.  Mirtazapine 7.5 mg oral tablet once a day.  6.  Loperamide 2 mg oral capsule 3 times a day as needed for diarrhea.  7.  Metoprolol 50 mg oral extended release once a day.   SERVICES:  Advised to have home health on discharge of hospice services.   DIET ON DISCHARGE:  Low sodium, diet supplement Ensure 3 times a day.   ACTIVITY: As tolerated.    FOLLOWUP ON DISCHARGE:  Advised to follow in 1-2 weeks with primary care physician's office, Marcus Gates.  HISTORY OF PRESENTING ILLNESS: An 81 year old male who presented to the Emergency Room for 4-5 days complaint of watery diarrhea.  Also denied any complaint of nausea and vomiting, but had no fever, and no abdominal pain. He had an episode of Clostridium difficile in the past about 4 months ago and so he came to Emergency Room because of fear of that.  His creatinine was found around 1.6 and had tachycardia up to 150 on presentation to ER, so he was admitted for further management of this issue.   HOSPITAL COURSE AND STAY: 1.  Diarrhea likely chronic, but patient was slightly dehydrated. CD4 was negative on check-up and IV fluid and loperamide was given, and the patient had improvement in his diarrhea, so we decided to discharge him home with advice to follow with primary care physician.  2.  Acute on chronic kidney  failure. His creatinine was improving with IV fluid and so discharge him home.  3.  Hypertension and atrial fibrillation.  Was on metoprolol. He stayed in normal sinus rhythm. Blood pressure was stable. 4.  Weight loss.  High suspicion for GI malignancy per previous notes. He had elevated CA 19-9 in previous admissions and he was discharged with hospice at home.   IMPORTANT LABORATORY RESULTS IN THE HOSPITAL:  1.  On presentation, glucose 84, BUN 15, creatinine 1.6, sodium 130, potassium is 3.6, chloride 102, and CO2 is 22.  Calcium is 6.8.  2.  WBC 8.7, hemoglobin is 9.6, platelet count 220,000, MCV 88.  3.  Pelvic x-ray shows negative.  CT head without contrast: No acute intracranial findings, severe atrophy and chronic small vessel ischemic disease.  4.  CT cervical spine without contrast shows no acute intracranial findings, severe atrophy, and chronic small vessel ischemic disease cervical spine fracture. 5.  Urinalysis is negative. Troponin less than 0.02, magnesium 1.4, Clostridium difficile in the stool is negative.  6.  On further follow-up, creatinine came to 1.36 on next day, magnesium 2.3.   Total time spent on this discharge is 40 minutes.    ____________________________ Hope PigeonVaibhavkumar G. Elisabeth PigeonVachhani, MD vgv:sp D: 02/28/2015 09:12:45 ET T: 02/28/2015 10:47:59 ET JOB#: [id number]  cc: Hope PigeonVaibhavkumar G. Elisabeth PigeonVachhani, MD, <Dictator> John B. Danne HarborWalker III, MD Heath GoldVAIBHAVKUMAR [facility]ertonVACHHANI MD ELECTRONICALLY SIGNED 03/16/2015 0:19

## 2015-04-09 NOTE — Discharge Summary (Signed)
PATIENT NAME:  Marcus Gates, Marcus Gates MR#:  [id number] DATE OF BIRTH:  29-Jul-1930  DATE OF ADMISSION:  01/04/2015 DATE OF DISCHARGE:  01/11/2015  CONSULTING PHYSICIANS:  Dow AdolphMatthew Rein, MD, of GI and Ned GraceNancy Phifer, MD, of palliative care  PRIMARY CARE PHYSICIAN:  John B. Danne HarborWalker III, MD    DISCHARGE DIAGNOSES: 1.  Sepsis due to gram-negative bacteremia.  2.  Right elbow cellulitis.  3.  Debility and recurrent falls.  4.  Urinary retention. 5.  Anemia.  6.  Weight loss, severe malnutrition, and failure to thrive with possible gastrointestinal malignancy.   HISTORY OF PRESENT ILLNESS:  Please see initial history and physical for details. Briefly, this is an 81 year old gentleman who lives at home with his wife and has some progressive dementia. He has also been having ongoing falling, increasing weakness, and a 30-pound weight loss. He was being worked up as an outpatient and had a prior admission as well for Escherichia coli sepsis. It was suspected he may have a GI malignancy, but the patient is refusing colonoscopy and the family was afraid he was too weak for it. The patient was admitted on January 27 with redness in his right arm, increasing falling, and hypotension.   HOSPITAL COURSE BY ISSUE:  1.  Sepsis. The patient had sepsis likely due to the cellulitis in his right elbow. The cellulitis improved with IV antibiotics. Blood cultures grew Sphingobacterium sensitive to ciprofloxacin. He was transitioned from vancomycin and Zosyn to ciprofloxacin and will complete a 10-day course.  2.  Failure to thrive, profound malnutrition. The patient was seen by dietary. He was started on Ensure 3 times a day. His albumin was quite low at 1.2 at one point. He is also on Remeron to stimulate his appetite.  3.  Possible GI malignancy. The patient has had CT scans of his abdomen in the past, which were negative. Given the weight loss, anemia, and GI symptoms, it is concerning for a GI malignancy. The patient and the  family have been declining colonoscopy. The patient was seen by Dr. Shelle Ironein, who still felt colonoscopy was indicated; however, the patient is declining at this time. He can reconsider if his strength improves. He was seen by palliative care, who recommended potential home hospice if he wishes once he returns home.  4.  Hypertension. The patient was on 3 antihypertensives. These will be held, and he will be discharged only on metoprolol 100 mg once a day. Other medications can be added back as needed.  5.  Urinary retention. The patient has a history of BPH and is on Flomax 0.8. He has had issues with difficulty voiding due to being largely bedbound. Foley catheter was attempted to be removed on February 1 but had to be replaced. This will hopefully be able to be removed in the next several days at skilled nursing as he becomes increasingly mobile.  6.  Anemia. This is chronic. Ferritin level was quite elevated at 500. He is in need of a colonoscopy but has declined. Hemoglobin remains in the mid 8s.  7.  Hyperlipidemia. It was decided to stop his statin at this point given polypharmacy.  8.  History of Clostridium difficile. He had some diarrhea as an inpatient. He had Clostridium difficile in June 2015. Clostridium difficile sample was taken and was negative as an inpatient. He will be monitored for Clostridium difficile as an outpatient.  9.  Increasing falls. Physical therapy recommended skilled nursing facility. The patient and family were initially very hesitant.  However, he has advancing dementia and his wife also seems to have some memory issues. Their daughter is very involved in their care. All agree that skilled nursing would be appropriate with a possible transition to home with home hospice.  10.  CODE STATUS:  Despite being a candidate for hospice, the patient requests remaining full code. This was addressed by palliative care as well.   DISCHARGE MEDICATIONS: 1.  Metoprolol 100 mg once a day.   2.  Ciprofloxacin 500 mg twice a day until February 6.  3.  Vitamin D3, 1000 units twice a day.  4.  Omeprazole 20 mg once a day.  5.  Remeron 15 mg at bedtime.  6.  Tamsulosin 0.4 mg 2 capsules at night.  7.  Tylenol 325, 1 to 2 tablets every 4 hours as needed for pain or fever.  8.  Ensure Plus 237 mL or 1 can 3 times a day with meals.  DISCHARGE FOLLOWUP:  Discharged to skilled nursing facility. The patient will follow up with Dr. Dan Humphreys within 2 weeks. He can also follow up with GI if he decides he wants a colonoscopy.   TIME SPENT:  This discharge took 45 minutes.    ____________________________ Stann Mainland. Sampson Goon, MD dpf:nb D: 01/10/2015 20:54:49 ET T: 01/10/2015 22:02:03 ET JOB#: [id number]  cc: Stann Mainland. Sampson Goon, MD, <Dictator> DAVID Sampson Goon MD ELECTRONICALLY SIGNED 01/18/2015 20:49

## 2015-04-09 NOTE — Consult Note (Signed)
   Comments   I had another meeting with pt's wife and his 2 daughters who just arrived from out of town. Updated daughters on pt's condition. Wife now says that she wants pt to be a full code. She says that pt has told her in the past that he wants to be resuscitated. Daughters agree. Code status reversed. Pt is full code.   Electronic Signatures: Paulanthony Gleaves, Harriett SineNancy (MD)  (Signed 12-Feb-16 15:32)  Authored: Palliative Care   Last Updated: 12-Feb-16 15:32 by Andreal Vultaggio, Harriett SineNancy (MD)

## 2015-04-09 NOTE — Consult Note (Signed)
Pt with sepsis of GPC bacteria, had nausea and vomiting, some pain with swallowing, hx of elevated LFT's in the past when here also for sepsis.  Nurse reported 20-30cc of blood per vomiting episode.  At this time given his overall health I would hold EGD or other invasive studies and treat with IV Protonix and Octreotide with prn Zofran.  Also can try clear liquids in small amounts.  He could have malignancy of UGI tract, could be esophagitis or stricture or ulcer.  Will follow with you.    Electronic Signatures: Scot JunElliott, Rayquon Uselman T (MD)  (Signed on 12-Feb-16 15:37)  Authored  Last Updated: 12-Feb-16 15:37 by Scot JunElliott, Mckenzye Cutright T (MD)

## 2015-04-09 NOTE — Progress Notes (Signed)
Patient Demographics  Marcus Gates, is a 81 y.o. male, DOB - October 03, 1930, WJX:[id number]  Admit date - 04/06/2015   Admitting Physician Marcus Manis, MD  Outpatient Primary MD for the patient is No primary care provider on file.  LOS - 3   No chief complaint on file.  left parotid cellulitis afib with RVR Anemia Dementia H/o falls Right  Arm DVT      Subjective:   Marcus Gates  Seen today,slightly confused.deneis any complaints,  Assessment & Plan   Consults  cardiollgy   Medications  Scheduled Meds: . amiodarone  400 mg Oral BID   Followed by  . [START ON 04/14/2015] amiodarone  200 mg Oral BID   Followed by  . [START ON 04/21/2015] amiodarone  200 mg Oral Daily  . heparin subcutaneous  5,000 Units Subcutaneous 3 times per day  . Melatonin  3 mg Oral QHS  . meropenem (MERREM) IV  1 g Intravenous Q12H  . mirtazapine  7.5 mg Oral QHS  . pantoprazole  40 mg Oral Q0600  . simvastatin  40 mg Oral Daily  . tamsulosin  0.8 mg Oral QPC breakfast  . traZODone  50 mg Oral QHS  . vancomycin  1,000 mg Intravenous Q24H   Continuous Infusions: . sodium chloride     PRN Meds:.acetaminophen, docusate sodium, senna, sodium chloride  DVT Prophylaxis  SCD  Lab Results  Component Value Date   PLT 134* 04/09/2015    Antibiotics   Anti-infectives    Start     Dose/Rate Route Frequency Ordered Stop   04/09/15 0615  meropenem (MERREM) 1 g in sodium chloride 0.9 % 50 mL IVPB     1 g 100 mL/hr over 30 Minutes Intravenous Every 12 hours 04/09/15 0601     04/09/15 0600  meropenem (MERREM) 1 g in sodium chloride 0.9 % 100 mL IVPB  Status:  Discontinued     1 g 200 mL/hr over 30 Minutes Intravenous Every 12 hours 04/08/15 1335 04/08/15 1611   04/09/15 0400  meropenem (MERREM) 1 g in sodium  chloride 0.9 % 100 mL IVPB  Status:  Discontinued     1 g 200 mL/hr over 30 Minutes Intravenous Every 12 hours 04/08/15 1611 04/09/15 0601   04/09/15 0100  vancomycin (VANCOCIN) IVPB 1000 mg/200 mL premix     1,000 mg 200 mL/hr over 60 Minutes Intravenous Every 24 hours 04/08/15 1611     04/09/15 0000  vancomycin (VANCOCIN) IVPB 1000 mg/200 mL premix  Status:  Discontinued     1,000 mg 200 mL/hr over 60 Minutes Intravenous Every 24 hours 04/08/15 1335 04/08/15 1611          Objective:   Filed Vitals:   04/07/15 1408 04/09/15 0530 04/09/15 1205  BP:  105/50 94/49  Pulse:  108   Temp:  98.6 F (37 C) 97.6 F (36.4 C)  TempSrc:   Oral  Resp:  20 20  Height: 5' 10.98" (1.803 m)    Weight: 84.6 kg (186 lb 8.2 oz)    SpO2:  96%     Wt Readings from Last 3 Encounters:  04/07/15 84.6 kg (186 lb 8.2 oz)     Intake/Output Summary (Last 24 hours) at 04/09/15 1341  Last data filed at 04/09/15 1256  Gross per 24 hour  Intake      0 ml  Output    300 ml  Net   -300 ml     Physical Exam  confused No new F.N deficits, Normal affect [place].AT,PERRAL Right arm swelling noted, Left facial cellulitis,. Supple Neck,No JVD, No cervical lymphadenopathy appriciated.  Symmetrical Chest wall movement, Good air movement bilaterally, CTAB RRR,No Gallops,Rubs or new Murmurs, No Parasternal Heave +ve B.Sounds, Abd Soft, No tenderness, No organomegaly appriciated, No rebound - guarding or rigidity. No Cyanosis, Clubbing or edema, No new Rash or bruise    Data Review   Micro Results Recent Results (from the past 240 hour(s))  Culture, blood (single)     Status: None (Preliminary result)   Collection Time: 04/06/15  2:30 AM  Result Value Ref Range Status   Micro Text Report   Preliminary       COMMENT                   NO GROWTH IN 48 HOURS   ANTIBIOTIC                                                      Culture, blood (single)     Status: None (Preliminary result)   Collection  Time: 04/06/15  2:49 AM  Result Value Ref Range Status   Micro Text Report   Preliminary       COMMENT                   NO GROWTH IN 48 HOURS   ANTIBIOTIC                                                      Urine culture     Status: None (Preliminary result)   Collection Time: 04/08/15  3:16 AM  Result Value Ref Range Status   Micro Text Report   Preliminary       SOURCE: INDWELLING CATHETER    ORGANISM 1                >100,000 CFU/ML GRAM NEGATIVE ROD   COMMENT                   ID TO FOLLOW SENSITIVITIES TO FOLLOW   ANTIBIOTIC                                                        Radiology Reports Ct Soft Tissue Neck W Contrast  04/06/2015   CLINICAL DATA:  81 year old male with right neck swelling  EXAM: CT NECK WITH CONTRAST  TECHNIQUE: Multidetector CT imaging of the neck was performed using the standard protocol following the bolus administration of intravenous contrast.  CONTRAST:  60 mL Omnipaque 300 administered intravenously  COMPARISON:  Prior head CT 02/23/2015  FINDINGS: Pharynx and larynx: Mild asymmetry of the nasopharynx with collapse of the left fossa of Rosenmller. There is no definite enhancing soft  tissue mass in the asymmetry is of uncertain clinical significance. Nonspecific dystrophic calcification at the left base of the tongue measuring 7.6 mm. Normal epiglottis, aryepiglottic folds and larynx.  Salivary glands: Diffuse edema and swelling of the left parotid gland with inflammatory stranding in the adjacent superficial subcutaneous fat. No significant ductal dilatation. No style low thigh assist. The right parotid gland and submandibular glands are within normal limits. No focal fluid collection to suggest abscess. Inflammatory stranding extends posteriorly of around the neck and inferiorly along the surface of the sternocleidomastoid muscle.  Thyroid: 11 mm peripherally calcified nodule in the left thyroid gland. No specific imaging followup is required for  lesions less than 15 mm.  Lymph nodes: No suspicious lymphadenopathy.  Vascular: Bilateral proximal internal carotid artery calcification greater on the right than the left. There may be a significant stenosis of the proximal right internal carotid artery. Jugular veins remain patent.  Limited intracranial: Within normal limits  Visualized orbits: Within normal limits.  Mastoids and visualized paranasal sinuses: Normally aerated.  Skeleton: No acute fracture or aggressive appearing lytic or blastic osseous lesion. Multilevel cervical spondylosis with anterior osteophyte formation.  Upper chest: Bilateral layering pleural effusions. Respiratory motion. Nonspecific patchy airspace opacity in the anterior aspect of the left upper lobe.  IMPRESSION: 1. Acute left parotiditis without evidence of sialolithiasis or abscess. 2. Atherosclerotic vascular calcifications including suspected hemodynamically significant stenosis of the right internal carotid artery. Recommend further evaluation with non emergent duplex ultrasound of the carotid arteries. 3. Nonspecific dystrophic calcification at the left tongue base may represent calcified lymphoid tissue. 4. Bilateral layering pleural effusions. 5. Nonspecific patchy airspace opacity in the anterior left upper lobe may reflect pneumonia or atelectasis.   Electronically Signed   By: Malachy Moan M.D.   On: 04/06/2015 02:06   Dg Chest Port 1 View  04/07/2015   CLINICAL DATA:  Suspect pneumonia, history of same.  EXAM: PORTABLE CHEST - 1 VIEW  COMPARISON:  Portable chest x-ray February nineteenth 2016  FINDINGS: The lungs are mildly hypoinflated. The interstitial markings are improved as compared to the previous study. The silhouette of a skin fold is noted on the left. There is no alveolar infiltrate. There is no pleural effusion. The cardiac silhouette is top-normal in size. The pulmonary vascularity is normal. The trachea is midline. The bony thorax is unremarkable.   IMPRESSION: Mild hypoinflation. There is no pneumonia nor other acute cardiopulmonary abnormality.   Electronically Signed   By: David  [place] M.D.   On: 04/07/2015 07:31   US Doppler Up Extr Right ([facility] Hx)  04/08/2015   CLINICAL DATA:  81 year old male with a history of right upper extremity edema.  EXAM: RIGHT UPPER EXTREMITY VENOUS DOPPLER ULTRASOUND  TECHNIQUE: Gray-scale sonography with graded compression, as well as color Doppler and duplex ultrasound were performed to evaluate the upper extremity deep venous system from the level of the subclavian vein and including the jugular, axillary, basilic, radial, ulnar and upper cephalic vein. Spectral Doppler was utilized to evaluate flow at rest and with distal augmentation maneuvers.  COMPARISON:  01/19/2015  FINDINGS: Contralateral Subclavian Vein: Respiratory phasicity is normal and symmetric with the symptomatic side. No evidence of thrombus. Normal compressibility.  Internal Jugular Vein: No evidence of thrombus. Normal compressibility, respiratory phasicity and response to augmentation.  Subclavian Vein: No evidence of thrombus. Normal compressibility, respiratory phasicity and response to augmentation.  Axillary Vein: Occlusive thrombus of the right axillary vein.  Cephalic Vein: No evidence of thrombus. Normal  compressibility, respiratory phasicity and response to augmentation.  Basilic Vein: No evidence of thrombus. Normal compressibility, respiratory phasicity and response to augmentation.  Brachial Veins: Occlusive thrombus of right brachial vein extending into the axillary vein. There is a paired brachial vein which remains patent.  Radial Veins: No evidence of thrombus. Normal compressibility, respiratory phasicity and response to augmentation.  Ulnar Veins: No evidence of thrombus. Normal compressibility, respiratory phasicity and response to augmentation.  Other Findings:  Upper extremity edema.  IMPRESSION: Sonographic survey right upper  extremity is positive for occlusive DVT involving the right axillary vein and 1 of the 2 paired brachial venous systems.  Signed,  Yvone NeuJaime S. Loreta AveWagner, DO  [facility]  [facility]   Electronically Signed   By: Gilmer MorJaime  Wagner D.O.   On: 04/08/2015 17:58     CBC  Recent Labs Lab 04/06/15 0026 04/07/15 0803 04/08/15 0353 04/09/15 0539  WBC 21.9* 17.7* 17.3* 16.3*  HGB 11.7* 8.8* 9.5* 6.0*  HCT 35.7* 26.8* 28.6* 18.5*  PLT 272 199 239 134*  MCV 85 85 85 85.0  MCH 27.8 27.9 28.2 27.8  MCHC 32.8 32.8 33.1 32.7  RDW 15.4* 15.8* 16.0* 16.2*  LYMPHSABS 1.7 1.7 1.8  --   MONOABS 0.5 0.3 0.3  --   EOSABS 0.0 0.1 0.1  --   BASOSABS 0.2* 0.1 0.1  --     Chemistries   Recent Labs Lab 04/06/15 0026 04/07/15 0454 04/08/15 0353  CO2 22 18* 17*  BUN 23* 23* 22*  CREATININE 1.76* 1.65* 1.70*  CALCIUM 7.5* 6.7* 6.5*   ------------------------------------------------------------------------------------------------------------------ estimated creatinine clearance is 35.1 mL/min (by C-G formula based on Cr of 1.7). ------------------------------------------------------------------------------------------------------------------ No results for input(s): HGBA1C in the last 72 hours. ------------------------------------------------------------------------------------------------------------------ No results for input(s): CHOL, HDL, LDLCALC, TRIG, CHOLHDL, LDLDIRECT in the last 72 hours. ------------------------------------------------------------------------------------------------------------------ No results for input(s): TSH, T4TOTAL, T3FREE, THYROIDAB in the last 72 hours.  Invalid input(s): FREET3 ------------------------------------------------------------------------------------------------------------------ No results for input(s): VITAMINB12, FOLATE, FERRITIN, TIBC, IRON, RETICCTPCT in the last 72 hours.  Coagulation profile No results for  input(s): INR, PROTIME in the last 168 hours.  No results for input(s): DDIMER in the last 72 hours.  Cardiac Enzymes No results for input(s): CKMB, TROPONINI, MYOGLOBIN in the last 168 hours.  Invalid input(s): CK ------------------------------------------------------------------------------------------------------------------ Invalid input(s): POCBNP   Active Problems:   Sepsis Due to  Parotid cellulitis; UTI;continue meropenem,vancomycin 2.left arm DVT;[facility]/dementia 3.advanced dementia/h/o falls/mutliple recurrent admission 4.acute on chronic renal failure .acute on chronic anemia;transfuse one unit PRBC,check stool occult  blood      Code Status:full code,prognosis  Is poor,wife is requesting to be full code,palliatve care team consult is appreciated  Family Communication: ;wil d/w wife  Disposition Plan:TBD     Time Spent in minutes   35 min   Pacen Watford M.D on 04/09/2015 at 1:41 PM

## 2015-04-09 NOTE — H&P (Signed)
PATIENT NAME:  Marcus Gates, WILLCUTT MR#:  [id number] DATE OF BIRTH:  1930-09-20  DATE OF ADMISSION:  04/06/2015  CHIEF COMPLAINT: Left neck swelling and redness.   HISTORY OF PRESENT ILLNESS: This is an 81 year old gentleman who comes to the ED for complaint of left neck and face swelling and tenderness. He states that this has been going on for the last 1-2 days, got significantly worse over the past 24 hours prior to coming to the ED. He denies any fever. He has had some diarrhea. Denies abdominal pain. Denies any chills. He states that the redness just started on its own. He does not recall scratching or any lesions or anything that would cause an infection or cellulitis in that area. He is a patient at [facility]; however, he states that he wishes to be full code. In the ED, he was found to be tachycardic with a white count of 21.9 and hypotensive. He was given fluids and his blood pressure responded well. Hospitalists were called for admission for sepsis and cellulitis.   PRIMARY CARE PHYSICIAN: John B. Danne Harbor, MD   PAST MEDICAL HISTORY: GERD, hypertension, hyperlipidemia, hernia, BHP.   MEDICATIONS: Vitamin D daily, trazodone 50 mg daily, tamsulosin 0.4 mg 2 capsules daily, simvastatin 40 mg daily, Prilosec 20 mg daily, mirtazapine 7.5 mg daily, melatonin 3 mg daily, Dilaudid 1-2 mg p.o. q. 4 p.r.n. pain.   PAST SURGICAL HISTORY: Colon resection. .   ALLERGIES: MORPHINE CAUSES A RASH; ROCEPHIN CAUSES A RASH.   FAMILY HISTORY: Stroke.   SOCIAL HISTORY: Former smoker. Does not use alcohol or illegal drugs.   REVIEW OF SYSTEMS:  CONSTITUTIONAL: Denies fever, fatigue, or weakness.  EYES: Denies blurred or double vision, pain or redness.  EAR, NOSE, AND THROAT: Denies ear pain, hearing loss, difficulty swallowing.  RESPIRATORY: Denies cough, dyspnea, painful respiration.  CARDIOVASCULAR: Denies chest pain, edema, or palpitations.  GASTROINTESTINAL: Denies nausea or vomiting. Endorses some  diarrhea. Denies abdominal pain or constipation.  GENITOURINARY: Denies dysuria, hematuria, or frequency.  ENDOCRINE: Denies nocturia, thyroid problems, heat or cold intolerance.  HEMATOLOGIC AND LYMPHATIC: Denies easy bruising, bleeding, swollen glands.  INTEGUMENTARY: Denies acne or lesion. Endorses in the left neck and facial swelling and erythema, as stated per HPI.  MUSCULOSKELETAL: Denies acute arthritis, joint swelling, or gout.  NEUROLOGICAL: Denies numbness, weakness, headache.  PSYCHIATRIC: Denies anxiety, insomnia, depression.   PHYSICAL EXAMINATION:  VITAL SIGNS: Blood pressure 93/61, pulse 120, temperature 98.4, respirations 20 with 95% oxygen saturation on room air.  GENERAL: This is an elderly gentleman lying supine in bed in no acute distress.  HEENT: Pupils equal, round, and react to light and accommodation. Extraocular movements intact. No scleral icterus. Dry mucosal membranes.  NECK: There is an oblong area of swelling on his left neck up to the angle of his jaw just anterior to his ear with underlying induration and surrounding erythema without any fluctuance palpated. The area is significantly tender. No thyromegaly. Neck supple except as stated above. No JVD noted. No cervical adenopathy palpated.  RESPIRATORY: Clear to auscultation bilaterally with no rales, rhonchi, or wheezing. No respiratory distress.  CARDIOVASCULAR: Tachycardic with no murmurs, rubs, or gallops on exam. Good pedal pulses. No lower extremity edema.  ABDOMEN: Soft, nontender, nondistended with good bowel sounds.  MUSCULOSKELETAL: Muscular strength 5/5 in all 4 extremities. Full spontaneous range of motion throughout. No cyanosis or clubbing.  SKIN: No rash. No lesions. Erythema as noted above on his left neck. Otherwise skin is warm,  dry, and intact.  LYMPHATIC: No adenopathy.  NEUROLOGICAL: Cranial nerves intact and intact throughout. No dysarthria or aphasia.  PSYCHIATRIC: Alert and oriented,  cooperative, not agitated.   LABORATORY DATA: White count 21.9, hemoglobin 11.7, hematocrit 35.7, platelets 272,000. Sodium 132, potassium 4.2, chloride 103, bicarbonate 22, BUN 23, creatinine 1.76, glucose 103, calcium 7.5.   RADIOLOGY: CT neck with contrast: Acute left parotitis without evidence of sialolithiasis or abscess. Atherosclerotic vascular calcifications including suspected hemodynamically significant stenosis of the right internal carotid artery, recommend further evaluation with nonemergent duplex ultrasound of the carotid arteries. Nonspecific dystrophic calcification of the left tongue base, may represent calcified lymphoid tissue. Bilateral air and pleural effusions. Nonspecific patchy airspace opacity in the anterior left upper lobe may reflect pneumonia or atelectasis.   ASSESSMENT AND PLAN:  1.  Sepsis: The patient meets this criteria with tachycardia and a white blood cell count. It is likely due to the cellulitis of his left face and neck. We will check a lactic acid. He is responding well to fluid resuscitation, so we will continue with this and treat his cellulitis as per the problem below. We will also order blood cultures. We will also get a screening urinalysis  and a dedicated chest x-ray to evaluate for suspected pneumonia per his CT scan  2.  Left face and neck cellulitis: The patient was given vancomycin for this in the Emergency Department. There was no abscess seen on CT scan. We will continue vancomycin to treat this.  3.  Hyperlipidemia: This is a chronic stable problem. We will continue his home simvastatin for this.  4.  Gastroesophageal reflux disease: This is a chronic stable problem. We will keep the patient on a proton pump inhibitor while inpatient.  5.  Insomnia: This is a chronic stable problem. We will continue his home sleep aids.  6.  Deep vein thrombosis prophylaxis. Subcutaneous heparin.   CODE STATUS: This patient is full code.   TIME SPENT ON THIS  ADMISSION: 50 minutes.    ____________________________ Candace Cruiseavid F. Anne HahnWillis, MD dfw:bm D: 04/06/2015 04:13:10 ET T: 04/06/2015 05:22:08 ET JOB#: [id number]  cc: Candace Cruiseavid F. Anne HahnWillis, MD, <Dictator> Maryelizabeth Eberle Scotty CourtF Anthonymichael Munday MD ELECTRONICALLY SIGNED 04/06/2015 21:02

## 2015-04-09 NOTE — Consult Note (Signed)
Brief Consult Note: Diagnosis: UGI bleed.   Patient was seen by consultant.   Comments: Small amount of  bright red emesis (in his bag at bedside which I examined)  this am associated with CP and Ntg/dilaudid. Patient says he is feeling 'OK". He is drowsy. Wife at bedside. Wife declines EGD as it is invasive. Dr. Mechele CollinElliott orders Sandostatin and IV Protonix bid. Serial hgb. Palliative care consult noted. Full consult note to follow.  Electronic Signatures: Rowan BlaseMills, Kolter Reaver Ann (NP)  (Signed 12-Feb-16 14:39)  Authored: Brief Consult Note   Last Updated: 12-Feb-16 14:39 by Rowan BlaseMills, Lucus Lambertson Ann (NP)

## 2015-04-09 NOTE — Consult Note (Signed)
Pt with poor oral intake, neg MRI of abdomen.  May have dementia enough to cause poor intake.  Suggest psy and neurology consults.  Can try empiric antidiarrheals and or Entocort for diarrhea. (effective in microscopic colitis).  This can be done without putting him through a colonoscopy.  Usually see results in 3-4 days.  Electronic Signatures: Scot JunElliott, Joi Leyva T (MD)  (Signed on 22-Feb-16 15:15)  Authored  Last Updated: 22-Feb-16 15:15 by Scot JunElliott, Trishelle Devora T (MD)

## 2015-04-09 NOTE — Consult Note (Signed)
PATIENT NAME:  Marcus Gates, Marcus Gates MR#:  [id number] DATE OF BIRTH:  Apr 16, 1930  DATE OF CONSULTATION:  01/09/2015  REFERRING PHYSICIAN: Stann Mainlandavid P. Sampson GoonFitzgerald, MD   CONSULTING PHYSICIAN:  Dow AdolphMatthew Raphael Fitzpatrick, MD  REASON FOR THE CONSULT: Anemia, weight loss, failure to thrive.   HISTORY OF PRESENT ILLNESS: Marcus Gates is an 81 year old male with a past medical history notable for hypertension, GERD, history of Escherichia coli sepsis, who initially presented to the Emergency Room for evaluation of weakness, fall, failure to thrive. I have seen Marcus Gates in GI Clinic for evaluation of weight loss, poor appetite, anemia. We did perform a CT scan which was negative. I also recommended EGD and colonoscopy, in the setting of the anemia and the weight loss. However, he did not want to undergo this colonoscopy or upper endoscopy due to the difficulty with the preparation and also the financial expenses. In addition, he was noted to have elevated liver enzymes and serologic workup was negative. It did come to light that he was drinking significant amounts of alcohol at home.   He is now readmitted to the hospital for what appears to be failure to thrive, weight loss, anemia. Per the primary team, he continues to refuse endoscopies at this time.   In talking to Marcus Gates and his wife this admission, he seems to downplay any symptoms. He says he has not been eating much, but has been eating more here in the hospital. He also states that he gets around well at home, which does not seem to be the case. He again is declining EGD or colonoscopy. He is aware that there may be a risk of missing malignancy.   He denies any trouble at this time with rectal bleeding, melena, abdominal pain, nausea, vomiting, dysphagia.   PAST MEDICAL HISTORY:  1.  Hypertension.  2.  GERD.  3.  Hyperlipidemia.  4.  Diverticulosis.  5.  History of Escherichia coli sepsis.  6.  History of clostridium difficile.   ALLERGIES: MORPHINE, ROCEPHIN, AND  ACE INHIBITORS.   FAMILY HISTORY: No family history of GI malignancy.   SOCIAL HISTORY: He lives with his wife. Denies alcohol or recreational drugs.   HOME MEDICATIONS:  1.  Clonidine 0.1 mg twice a day.  2.  Hydrochlorothiazide 25 daily.  3.  Losartan 100 mg daily.  4.  Metoprolol succinate 100 mg extended release daily.  5.  Remeron 15 mg daily.  6.  Omeprazole 20 mg daily.  7.  Simvastatin 80 mg daily.  8.  Tamsulosin 0.8 mg daily.  9.  Vitamin D3 at 1000 units international daily.   REVIEW OF SYSTEMS: A 10 system review is conducted. It is negative except as stated in the HPI.   PHYSICAL EXAMINATION:  VITAL SIGNS: Temperature is 97.6, pulse is 94, respirations are 20, blood pressure 112/66. Pulse oximetry is 95% on room air.  GENERAL: Flat affect, otherwise elderly male in no acute distress. HEENT: Normocephalic/atraumatic. Extraocular movements are intact. Anicteric. NECK: Soft, supple. JVP appears normal. No adenopathy. CHEST: Clear to auscultation. No wheeze or crackle. Respirations unlabored. HEART: Regular. No murmur, rub, or gallop.  Normal S1 and S2. ABDOMEN: Soft, nontender, nondistended.  Normal active bowel sounds in all four quadrants.  No organomegaly. No masses EXTREMITIES: No swelling, well perfused. SKIN: No rash or lesion. Skin color, texture, turgor normal. NEUROLOGICAL: Grossly intact. PSYCHIATRIC: Normal tone and affect. MUSCULOSKELETAL: No joint swelling or erythema.  DATA: Creatinine is 1.38, BUN is 14, sodium 140, potassium 4.  Liver enzymes are notable for an albumin of 1.2, alkaline phosphatase is 148, AST 57, ALT 37. White count is 7, hemoglobin is 8, hematocrit is 25, MCV is 97. INR 1.1.   IMAGING: He did have a CT of his head that showed progressive white matter changes. He also had a CT abdomen and pelvis in December 2015 without any etiology for his weight loss.   ASSESSMENT AND PLAN: Failure to thrive, weight loss, anemia: I did again offer a  colonoscopy and an upper endoscopy to the patient and his wife. They decline both again at this time. I do actually wonder how much of this is actually just all consistent with progressive dementia. He does seem to have some significant dementia on my examination today and seems to minimize and confabulate many of the circumstances surrounding his hospitalization.   RECOMMENDATIONS:  1.  Would perform EGD and colonoscopy if they change their mind and decide they want to go ahead with this.  2.  Consider CT chest, abdomen and pelvis with contrast since previous studies were all without contrast, although with his borderline renal function this would carry some risk.  3.  A dementia evaluation.  4.  Further management per palliative care, given he has very severe malnutrition with an albumin at 1.2 and likely a very limited life expectancy.   Thank you for this consult.    ____________________________ Dow Adolph, MD mr:TT D: 01/09/2015 18:46:02 ET T: 01/09/2015 19:32:07 ET JOB#: [id number]  cc: Dow Adolph, MD, <Dictator> Kathalene Frames MD ELECTRONICALLY SIGNED 01/23/2015 15:12

## 2015-04-09 NOTE — Consult Note (Signed)
Chief Complaint:  Subjective/Chief Complaint Covering for Dr. Mechele CollinElliott. Diarrhea stopped when reglan stopped. No CP or reflux symptoms now. Tolerating diet. On protonix bid. Wants to go home soon.   VITAL SIGNS/ANCILLARY NOTES: **Vital Signs.:   20-Feb-16 08:35  Vital Signs Type Q 4hr  Temperature Temperature (F) 98.1  Celsius 36.7  Temperature Source oral  Pulse Pulse 91  Respirations Respirations 20  Systolic BP Systolic BP 121  Diastolic BP (mmHg) Diastolic BP (mmHg) 68  Mean BP 85  Pulse Ox % Pulse Ox % 93  Pulse Ox Activity Level  At rest  Oxygen Delivery 1L   Brief Assessment:  GEN no acute distress   Cardiac Regular   Respiratory clear BS   Gastrointestinal Normal   Assessment/Plan:  Assessment/Plan:  Assessment Severe reflux esophagitis. Gastroparesis. Diarrhea on reglan. Seems to be doing ok on protonix bid.   Plan Continue PPI bid. Do not resume reglan at this point. Will follow. Thanks.   Electronic Signatures: Lutricia Feilh, Daisi Kentner (MD)  (Signed 20-Feb-16 11:37)  Authored: Chief Complaint, VITAL SIGNS/ANCILLARY NOTES, Brief Assessment, Assessment/Plan   Last Updated: 20-Feb-16 11:37 by Lutricia Feilh, Ricardo Kayes (MD)

## 2015-04-09 NOTE — Consult Note (Signed)
Pt more animated and alert.  Family with him.  His appetite has improved.  Will advance diet to full liquid.  One of his family members brought him a hot dog from a [employer] [place] and I encouraged him not to eat it as it would be advancing his diet too quickly and could cause vomiting.  No new suggstions.  Electronic Signatures: Scot JunElliott, Robert T (MD)  (Signed on 13-Feb-16 15:59)  Authored  Last Updated: 13-Feb-16 15:59 by Scot JunElliott, Robert T (MD)

## 2015-04-09 NOTE — Consult Note (Signed)
Will change iv to oral reglan and iv protonix to oral.  Will advance to puree diet and if tol can advance to mechanical soft diet.  Electronic Signatures: Scot JunElliott, Villa Burgin T (MD)  (Signed on 18-Feb-16 15:24)  Authored  Last Updated: 18-Feb-16 15:24 by Scot JunElliott, Kyheem Bathgate T (MD)

## 2015-04-09 NOTE — Consult Note (Signed)
Plan for EGD tomorrow due to continued blood loss.  Risks and benefits discussed with family.  He had a few beats of non sustained Vtach and some of at fib.  May give a little lidocaine before procedure.  He got a unit of blood today, no more black stool reported.  hgb 7.3 this am. dark stool reported this am. 99% sat on 2L oxygen, R 18, P 78, T. 97.9, BP 127/72.  PE shows chest clear in anterior fields.    Electronic Signatures: Scot JunElliott, Robert T (MD)  (Signed on 14-Feb-16 15:30)  Authored  Last Updated: 14-Feb-16 15:30 by Scot JunElliott, Robert T (MD)

## 2015-04-09 NOTE — H&P (Signed)
PATIENT NAME:  Marcus Gates, Marcus Gates MR#:  [id number] DATE OF BIRTH:  22-Dec-1929  DATE OF ADMISSION:  01/04/2015  PRIMARY CARE PROVIDER: Dr. Lisette Grinder with [facility].   CHIEF COMPLAINT: Weakness, fall, right arm redness.   HISTORY OF PRESENTING ILLNESS: An 81 year old Caucasian male patient with history of hypertension, CKD stage III, presents to the Emergency Room brought in by family after they noticed that the patient has been progressively getting worse over the past month. The patient has had balance problems for while which are progressively getting worse, but has had recurrent falls in spite of using his walker over the last month. The patient had some skin tear on his right elbow area which has gotten red, tender. The patient has not had any fever at home. Here in the Emergency Room the patient has been found to have tachycardic, elevated white count, sepsis due to right arm cellulitis, is being admitted to the hospitalist service.   The patient was admitted in 2015 with Escherichia coli sepsis of unknown source, at that time also [facility] spotted fever was suspected in this patient.   He does not complain of any nausea or vomiting. Has had decreased urination, poor appetite. He has lost about 30 pounds in the last month per his wife.   He does not complain of any focal weakness or numbness. Has generalized weakness.   PAST MEDICAL HISTORY:  1. Hypertension.  2. GERD.  3. History of lower GI bleed.  4. Hyperlipidemia.  5. Diverticulosis.  6. Colovesicular fistula surgery in 2005.  7. History of Escherichia coli sepsis of unknown source.  8. History of Clostridium difficile.     ALLERGIES: MORPHINE, ROCEPHIN, AND ACE INHIBITORS.   FAMILY HISTORY:  Reviewed, positive for CAD.    SOCIAL HISTORY: The patient does not smoke. No alcohol. No illicit drugs. Ambulates with a walker.     CODE STATUS: Full code.   HOME MEDICATIONS:  1. Clonidine 0.1 mg 2 times a day.   2. Hydrochlorothiazide 25 mg daily.  3. Losartan 100 mg daily.  4. Metoprolol succinate 100 mg extended-release daily.  5. Remeron 15 mg daily.  6. Omeprazole 20 mg oral daily.  7. Simvastatin 80 mg daily.  8. Tamsulosin 0.8 mg daily.  9. Vitamin D3, 1000 international units daily.   REVIEW OF SYSTEMS: CONSTITUTIONAL: Complains of fatigue and weight loss.  EYES: No blurred vision, pain, redness.  EARS, NOSE, AND THROAT: No tinnitus, ear pain, hearing loss.  RESPIRATORY: No cough, wheeze, hemoptysis.  CARDIOVASCULAR: No chest pain, orthopnea, edema.  GASTROINTESTINAL: No nausea, vomiting, diarrhea, abdominal pain.  GENITOURINARY: No dysuria, hematuria. Has decreased urination.  ENDOCRINE: No polyuria, nocturia, thyroid problems.  HEMATOLOGIC AND LYMPHATIC: No anemia, easy bruising.  INTEGUMENTARY: No acne, rash, lesion. Has redness, and pain around the right elbow area.  MUSCULOSKELETAL: No joint pain.  NEUROLOGIC: No focal numbness, weakness.   PSYCHIATRIC: No anxiety or depression.   PHYSICAL EXAMINATION:  VITAL SIGNS:  Shows temperature 97.8, pulse of 130, blood pressure 115/53, saturating 94% on room air.  GENERAL: Elderly Caucasian male patient lying in bed, overall seems comfortable.  PSYCHIATRIC: Alert and oriented x 3, flat affect. HEENT: Normocephalic, atraumatic.  Oral mucosa dry and pink. Pallor positive. No icterus. Pupils bilaterally equal and reactive to light.  NECK: Supple. No thyromegaly. No palpable lymph nodes. Trachea midline. No carotid bruit, JVD.  CARDIOVASCULAR: S1, S2 with tachycardia, no murmurs. Peripheral pulses 2 +. Trace edema.  RESPIRATORY: Normal work of  breathing. Clear to auscultation on both sides.  GASTROINTESTINAL: Soft abdomen, nontender. Bowel sounds present. No hepatosplenomegaly palpable.  SKIN: Warm and dry.  No petechiae, rash, or ulcers.  Has skin tears on the right elbow area with redness, tenderness, no fluctuance noticed. Normal range  of motion of the right elbow. Bruising in other parts of the body.  NEUROLOGICAL: Motor strength 4/5 in lower extremities, 5 - /5  in upper extremities, symmetrical. Sensation to fine touch intact all over.  LYMPHATIC: No cervical lymphadenopathy.   LABORATORY STUDIES: Glucose 95, BUN 21, creatinine 1.71, sodium 132, potassium 4, magnesium 1.5. AST elevated at 119, alkaline phosphatase 231, bilirubin 1.7,CK of 18. Troponin 0.03. WBC 16.9, hemoglobin 11, platelets 229,000. INR 1.1. Urinalysis shows trace bacteria but only 2 WBCs. Lactic acid 2.   EKG shows sinus tachycardia, nothing acute.   CT scan of the head without contrast shows no acute abnormalities, progressive nonspecific cerebral white matter changes, also cerebral atrophy.   Chest x-ray portable shows nothing acute.   Right elbow x-ray shows no acute fracture dislocation.   ASSESSMENT AND PLAN:  1.  Sepsis with right upper extremity cellulitis. The patient does not have any fluctuance, no concern for abscess at this point. We will check an ESR, but his elbow does not seem to be involved at this point. We will start him broadly on vancomycin and Zosyn, send for blood cultures. The patient will be bolused IV fluids for his sepsis and tachycardia. Also put on maintenance fluids after that, monitor Is and Os closely. The patient did have Escherichia coli sepsis in the past of unknown source and we will wait for blood cultures. 2.  Recurrent falls. This has been a progressively worsening issue over the past few months according to wife. Continues to use his walker. Could be from arthritis, also contributed by the acute infection. Consult physical therapy. May need further PT after discharge or rehabilitation. 3.  Hypertension. Hold medications due to hypotension. We will continue his metoprolol considering his tachycardia and now that his blood pressure is mildly improved with IV fluids. 4.  Elevated AST, alkaline phosphatase, bilirubin. The  patient has had elevated LFTs in the past that seem to have normalized. At this time these are only mildly elevated, could be from hemoconcentration. We will repeat in the morning. Will need further workup if they continue to worsen.  5.  Chronic kidney disease stage III, stable.  6.  Anemia of chronic disease, stable.  7.  Deep vein thrombosis prophylaxis with Lovenox.   CODE STATUS: Full code.   TIME SPENT TODAY ON THIS CASE: 40 minutes.     ____________________________ Leia Alf Zandra Lajeunesse, MD srs:bu D: 01/04/2015 19:08:35 ET T: 01/04/2015 19:43:08 ET JOB#: [id number]  cc: Alveta Heimlich R. Alyne Martinson, MD, <Dictator> John B. Sarina Ser, MD Alveta Heimlich Arlice Colt MD ELECTRONICALLY SIGNED 01/16/2015 3:55

## 2015-04-09 NOTE — H&P (Signed)
PATIENT NAME:  Marcus Gates, FALWELL MR#:  [id number] DATE OF BIRTH:  1930/01/02  DATE OF ADMISSION:  02/22/2015  CHIEF COMPLAINT:  Diarrhea.   HISTORY OF PRESENT ILLNESS:  This is an 81 year old male who presents to the ED for 4 to 5 days of watery diarrhea with 3 to 4 medium-to-large volume stools per day. The patient denies any blood in his diarrhea. He states he has no abdominal pain. No nausea or vomiting. No fever. No other symptoms. See full review of systems below. The patient states that he had an episode of Clostridium difficile in the past about 4 months ago, and he says that this seems sort of like when he had Clostridium difficile before. The patient also has an elevated creatinine, though it is pretty much at baseline at 1.6. The patient in the ED was also found to be persistently tachycardic in the 150s. While he was in the ED, he had a fall and had subsequent imaging done, which was all negative for any trauma. Hospitalists were called for admission for observation to rule out Clostridium difficile and to evaluate his persistent tachycardia. Of note, the patient is also reported to be significantly dehydrated.   PRIMARY CARE PHYSICIAN:  John B. Danne Harbor, MD   PAST MEDICAL HISTORY:  GERD, hyperlipidemia, hypertension.   CURRENT MEDICATIONS:  Ambien 10 mg daily, Flomax 0.4 mg daily, Protonix 40 mg b.i.d., mirtazapine 15 mg daily, metoprolol succinate 100 mg daily, metoclopramide 5 mg t.i.d. with meals, hydrochlorothiazide 25 mg daily, diltiazem continuous release 120 mg daily, loperamide 2 mg b.i.d. p.r.n. diarrhea.   PAST SURGICAL HISTORY:  Includes only back surgery.   ALLERGIES:  MORPHINE AND ROCEPHIN, BOTH OF WHICH GIVE HIM A RASH.   FAMILY HISTORY:  Hypertension and coronary artery disease.   SOCIAL HISTORY:  He is an ex-smoker. He quit smoking 43 years ago. He is a daily drinker. He drinks 1 to 2 vodka drinks a day. He denies any illicit drug use.   REVIEW OF  SYSTEMS: CONSTITUTIONAL:  Denies fever, fatigue, or weakness.  EYES:  Denies blurred or double vision, pain, or redness.  EAR, NOSE, AND THROAT:  Denies ear pain, hearing loss, or difficulty swallowing.  RESPIRATORY:  Denies cough, wheeze, hemoptysis, or dyspnea.  CARDIOVASCULAR:  Denies chest pain, orthopnea, edema, or palpitations.  GASTROINTESTINAL:  Denies nausea or vomiting. Endorses diarrhea as per HPI. Denies abdominal pain. Denies hematemesis or melena. Denies constipation.  GENITOURINARY:  Denies dysuria, hematuria, or frequency.  ENDOCRINE:  Denies nocturia, thyroid problems, or heat or cold intolerance.  HEMATOLOGIC AND LYMPHATIC:  Denies easy bruising or bleeding or swollen glands.  INTEGUMENTARY:  Denies acne, rash, or lesions.  MUSCULOSKELETAL:  Denies arthritis, joint swelling, or gout.  NEUROLOGICAL:  Denies numbness, weakness, or headache.  PSYCHIATRIC:  Denies anxiety, insomnia, or depression.   PHYSICAL EXAMINATION: VITAL SIGNS:  Blood pressure 105/58, pulse 150, temperature 97.5, respirations 18, oxygen saturations 96% on room air.  GENERAL:  This is an elderly gentleman, who is lying in bed in no apparent distress.  HEENT:  Pupils are equal, round, and reactive to light and accommodation. Extraocular movements are intact. No scleral icterus. Very dry mucosal membranes.  NECK:  His thyroid is not enlarged. His neck is supple with no masses. It is nontender. No cervical adenopathy. No JVD.  RESPIRATORY:  Clear to auscultation bilaterally with no rales, rhonchi, or wheezes. Good breath sounds throughout. No respiratory distress.  CARDIOVASCULAR:  Significant tachycardia with no murmur, rubs,  or gallops auscultated on exam, though auscultation for such was difficult. Good pedal pulses. No lower extremity edema.  ABDOMEN:  Soft, nontender, nondistended. Positive bowel sounds. No hepatosplenomegaly appreciated.  MUSCULOSKELETAL:  He has 5/5 muscular strength in all 4 extremities  with full spontaneous range of motion throughout. No cyanosis or clubbing.  SKIN:  No rashes or lesions. Skin is warm, dry, and intact.  LYMPHATIC:  No adenopathy.  NEUROLOGIC:  Cranial nerves are intact. Sensation is intact. No dysarthria or aphasia.  PSYCHIATRIC:  He is alert and oriented, cooperative, not agitated.   LABORATORY DATA:  White count is 8.7, hemoglobin 9.6, hematocrit 28.6, platelets 220,000. Sodium is 130, potassium 3.6, chloride 102, bicarbonate 22, BUN 15, creatinine 1.6, glucose 84, protein 4.9, albumin 1.4, total bilirubin 0.7, alkaline phosphatase 121, AST 66, ALT 26. Troponin is less than 0.03 x 2 draws. Urinalysis is negative.   RADIOLOGIC DATA:  Recent CT of the abdomen and pelvis on March 10 showed some anasarca with bilateral pleural effusions, ascites, and diffuse soft tissue edema. No acute abdominal pelvic findings. CT of the head and CT of the C-spine today show no acute findings. No cervical spine fracture. Pelvic x-ray today shows no fractures seen. No lesions seen.   ASSESSMENT AND PLAN: 1.  Diarrhea. There is a suspicion for Clostridium difficile here, as he has had it in the past and states that this is similar to when he had it before. We have sent for stool ova and parasites, fecal leukocytes, and stool culture, as well as Clostridium difficile. We will not start any antibiotics at this time, but we will wait for the Clostridium difficile assay to result before doing so.  2.  Dehydration. The patient is significantly dehydrated clinically on exam. We will give him IV fluids and rehydrate him. His electrolytes are largely stable at this time, and his renal function seems to be around baseline.  3.  Tachycardia. It is unclear at this time the exact cause of tachycardia, though likely due to the dehydration. We will repeat an EKG just to confirm his rhythm. We will give him IV fluids as stated above and base further treatment on repeat EKG findings.  4.  Hypertension.  This is well controlled. He is actually sort of lower end normal blood pressure at this time. We will hold some of his home medications for now. We will continue the Toprol-XL, but hold the diltiazem and hold the HCTZ.  5.  Benign prostatic hypertrophy, stable. We will continue his Flomax.  6.  Gastroesophageal reflux disease. This is stable. We will continue his Protonix.  7.  Deep vein thrombosis prophylaxis with subcutaneous heparin.   CODE STATUS:  This patient is full code.   TIME SPENT ON THIS ADMISSION:  45 minutes.    ____________________________ Candace Cruiseavid F. Anne HahnWillis, MD dfw:nb D: 02/23/2015 04:02:46 ET T: 02/23/2015 04:27:12 ET JOB#: [id number]  cc: Candace Cruiseavid F. Anne HahnWillis, MD, <Dictator> Kaylub Detienne Scotty CourtF Azreal Stthomas MD ELECTRONICALLY SIGNED 02/23/2015 5:34

## 2015-04-10 LAB — CREATININE, SERUM
Creatinine, Ser: 1.64 mg/dL — ABNORMAL HIGH (ref 0.61–1.24)
GFR calc Af Amer: 43 mL/min — ABNORMAL LOW (ref >60)
GFR, EST NON AFRICAN AMERICAN: 37 mL/min — AB (ref >60)

## 2015-04-10 LAB — URINE CULTURE

## 2015-04-10 LAB — VANCOMYCIN, TROUGH: VANCOMYCIN TR: 27 ug/mL — AB (ref 10–20)

## 2015-04-10 LAB — HEMOGLOBIN AND HEMATOCRIT, BLOOD
HCT: 30.9 % — ABNORMAL LOW (ref 40.0–52.0)
Hemoglobin: 10.3 g/dL — ABNORMAL LOW (ref 13.0–18.0)

## 2015-04-10 MED ORDER — VANCOMYCIN HCL IN DEXTROSE 1-5 GM/200ML-% IV SOLN
1000.0000 mg | INTRAVENOUS | Status: DC
  Administered 2015-04-10: 1000 mg via INTRAVENOUS
  Filled 2015-04-10: qty 200

## 2015-04-10 MED ORDER — ENSURE ENLIVE PO LIQD: 237.0000 mL | Freq: Three times a day (TID) | ORAL | Status: DC

## 2015-04-10 MED ORDER — ATORVASTATIN CALCIUM 20 MG PO TABS: 20.0000 mg | ORAL_TABLET | Freq: Every day | ORAL | Status: DC

## 2015-04-10 NOTE — Progress Notes (Signed)
Spoke with Dr Betti Cruzeddy regarding lasix order for after blood administration.  Told to assess patient and to notify MD if pt has s/s of fluid volume overload.  Otherwise, do not give lasix.  Cristela FeltHelen Iris Guidry, RN

## 2015-04-10 NOTE — Progress Notes (Addendum)
ANTIBIOTIC CONSULT NOTE - FOLLOW UP  Pharmacy Consult for Vancomycin/Meropenem Indication: sepsis due to cellulitis/UTI  Allergies  Allergen Reactions  . Morphine And Related Rash and Other (See Comments)  . Rocephin [Ceftriaxone] Rash    Patient Measurements: Height: 5' 10.98" (180.3 cm) Weight: 186 lb 8.2 oz (84.6 kg) IBW/kg (Calculated) : 75.26  Vital Signs: Temp: 97.6 F (36.4 C) (05/02 0038) Temp Source: Oral (05/02 0038) BP: 109/47 mmHg (05/02 0038) Pulse Rate: 102 (05/02 0038) Intake/Output from previous day: 05/01 0701 - 05/02 0700 In: 276 [Blood:276] Out: 575 [Urine:575] Intake/Output from this shift: Total I/O In: 276 [Blood:276] Out: -   Labs:  Recent Labs  04/07/15 0454 04/07/15 0803 04/08/15 0353 04/09/15 0539  WBC  --  17.7* 17.3* 16.3*  HGB  --  8.8* 9.5* 6.0*  PLT  --  199 239 134*  CREATININE 1.65*  --  1.70* 1.65*   Estimated Creatinine Clearance: 36.1 mL/min (by C-G formula based on Cr of 1.65).  Vancomycin trough drawn 5/1 @ 23:30 = 76mcg/ml (critical value, called by lab)  Microbiology: Recent Results (from the past 720 hour(s))  Culture, blood (single)     Status: None (Preliminary result)   Collection Time: 04/06/15  2:30 AM  Result Value Ref Range Status   Micro Text Report   Preliminary       COMMENT                   NO GROWTH IN 48 HOURS   ANTIBIOTIC                                                      Culture, blood (single)     Status: None (Preliminary result)   Collection Time: 04/06/15  2:49 AM  Result Value Ref Range Status   Micro Text Report   Preliminary       COMMENT                   NO GROWTH IN 48 HOURS   ANTIBIOTIC                                                      Urine culture     Status: None (Preliminary result)   Collection Time: 04/08/15  3:16 AM  Result Value Ref Range Status   Micro Text Report   Preliminary       SOURCE: INDWELLING CATHETER    ORGANISM 1                >100,000 CFU/ML GRAM  NEGATIVE ROD   COMMENT                   ID TO FOLLOW SENSITIVITIES TO FOLLOW   ANTIBIOTIC                                                        Anti-infectives    Start     Dose/Rate Route Frequency Ordered Stop  04/10/15 1200  vancomycin (VANCOCIN) IVPB 1000 mg/200 mL premix     1,000 mg 200 mL/hr over 60 Minutes Intravenous Every 36 hours 04/10/15 0141     04/09/15 0615  meropenem (MERREM) 1 g in sodium chloride 0.9 % 50 mL IVPB     1 g 100 mL/hr over 30 Minutes Intravenous Every 12 hours 04/09/15 0601     04/09/15 0600  meropenem (MERREM) 1 g in sodium chloride 0.9 % 100 mL IVPB  Status:  Discontinued     1 g 200 mL/hr over 30 Minutes Intravenous Every 12 hours 04/08/15 1335 04/08/15 1611   04/09/15 0400  meropenem (MERREM) 1 g in sodium chloride 0.9 % 100 mL IVPB  Status:  Discontinued     1 g 200 mL/hr over 30 Minutes Intravenous Every 12 hours 04/08/15 1611 04/09/15 0601   04/09/15 0100  vancomycin (VANCOCIN) IVPB 1000 mg/200 mL premix  Status:  Discontinued     1,000 mg 200 mL/hr over 60 Minutes Intravenous Every 24 hours 04/08/15 1611 04/10/15 0129   04/09/15 0000  vancomycin (VANCOCIN) IVPB 1000 mg/200 mL premix  Status:  Discontinued     1,000 mg 200 mL/hr over 60 Minutes Intravenous Every 24 hours 04/08/15 1335 04/08/15 1611      Assessment: Vancomycin supratherapeutic  Goal of Therapy:  Vancomycin trough level 15-20 mcg/ml  Plan:  Will change vancomycin to 1gm IV Q36H and recheck serum creatinine with AM labs.   Will continue current orders for meropenem 1gm IV Q12H  Follow up culture results  Marcus Gates C 04/10/2015,1:42 AM

## 2015-04-10 NOTE — Progress Notes (Addendum)
ANTIBIOTIC CONSULT NOTE - FOLLOW UP  Pharmacy Consult for Vancomycin/Meropenem Indication: Cellulitis/Sepsis  Allergies  Allergen Reactions  . Morphine And Related Rash and Other (See Comments)  . Rocephin [Ceftriaxone] Rash    Patient Measurements: Height: 5' 10.98" (180.3 cm) Weight: 186 lb 8.2 oz (84.6 kg) IBW/kg (Calculated) : 75.26   Vital Signs: Temp: 97.5 F (36.4 C) (05/02 0257) Temp Source: Oral (05/02 0257) BP: 99/47 mmHg (05/02 0745) Pulse Rate: 93 (05/02 0745) Intake/Output from previous day: 05/01 0701 - 05/02 0700 In: 542.7 [I.V.:5; Blood:537.7] Out: 575 [Urine:575] Intake/Output from this shift:    Labs:  Recent Labs  04/08/15 0353 04/09/15 0539 04/10/15 0457  WBC 17.3* 16.3*  --   HGB 9.5* 6.0*  --   PLT 239 134*  --   CREATININE 1.70* 1.65* 1.64*   Estimated Creatinine Clearance: 36.3 mL/min (by C-G formula based on Cr of 1.64).  Recent Labs  04/10/15 0018  VANCOTROUGH 27*     Microbiology: Recent Results (from the past 720 hour(s))  Culture, blood (single)     Status: None (Preliminary result)   Collection Time: 04/06/15  2:30 AM  Result Value Ref Range Status   Micro Text Report   Preliminary       COMMENT                   NO GROWTH IN 48 HOURS   ANTIBIOTIC                                                      Culture, blood (single)     Status: None (Preliminary result)   Collection Time: 04/06/15  2:49 AM  Result Value Ref Range Status   Micro Text Report   Preliminary       COMMENT                   NO GROWTH IN 48 HOURS   ANTIBIOTIC                                                      Urine culture     Status: None   Collection Time: 04/08/15  3:16 AM  Result Value Ref Range Status   Micro Text Report   Final       SOURCE: INDWELLING CATHETER    ORGANISM 1                >100,000 CFU/ML KLEBSIELLA PNEUMONIAE SSP PNEUMONI   ANTIBIOTIC                    ORG#1     AMPICILLIN                    R         CEFAZOLIN                      S         CEFOXITIN                     S         CEFTRIAXONE  S         CIPROFLOXACIN                 S         GENTAMICIN                    S         IMIPENEM                      S         LEVOFLOXACIN                  S         NITROFURANTOIN                I         TRIMETHOPRIM/SULFAMETHOXAZOLE S             Anti-infectives    Start     Dose/Rate Route Frequency Ordered Stop   04/10/15 1200  vancomycin (VANCOCIN) IVPB 1000 mg/200 mL premix     1,000 mg 200 mL/hr over 60 Minutes Intravenous Every 36 hours 04/10/15 0141     04/09/15 0615  meropenem (MERREM) 1 g in sodium chloride 0.9 % 50 mL IVPB     1 g 100 mL/hr over 30 Minutes Intravenous Every 12 hours 04/09/15 0601     04/09/15 0600  meropenem (MERREM) 1 g in sodium chloride 0.9 % 100 mL IVPB  Status:  Discontinued     1 g 200 mL/hr over 30 Minutes Intravenous Every 12 hours 04/08/15 1335 04/08/15 1611   04/09/15 0400  meropenem (MERREM) 1 g in sodium chloride 0.9 % 100 mL IVPB  Status:  Discontinued     1 g 200 mL/hr over 30 Minutes Intravenous Every 12 hours 04/08/15 1611 04/09/15 0601   04/09/15 0100  vancomycin (VANCOCIN) IVPB 1000 mg/200 mL premix  Status:  Discontinued     1,000 mg 200 mL/hr over 60 Minutes Intravenous Every 24 hours 04/08/15 1611 04/10/15 0129   04/09/15 0000  vancomycin (VANCOCIN) IVPB 1000 mg/200 mL premix  Status:  Discontinued     1,000 mg 200 mL/hr over 60 Minutes Intravenous Every 24 hours 04/08/15 1335 04/08/15 1611      Assessment: Supratherapeutic trough on 5/2 of 27.  Goal of Therapy:  Vancomycin trough level 15-20 mcg/ml  Plan:   Continue Meropenem 1 gm IV q12h.   Continue current orders for Vancomycin 1 gm IV q36h.  Will order trough for 5/5 at 1130 for monitoring purposes.  Continue to follow SCr closely.  Will check SCr in AM.   Clarisa Schoolsrystal Shann Lewellyn, PharmD Clinical Pharmacist 04/10/2015

## 2015-04-10 NOTE — Care Management (Addendum)
Informed that patient / [facility]  Attending has initiated transfer.  Spoke with patient's wife and informed that if the transfer is not medically necessary, then there is a chance that  The stay will not be covered by his insurance.  Informed attending.  Clydie BraunKaren with [facility].  She spoke with her administrator regarding coverage for transfer and stay.  Informed that the actual transport would not be covered but the hospital stay would be covered.  Informed patient's wife.  Spoke with attending regarding emtala and nonemergent transport documents.   Accepting MD is Dr.  Erick AlleyEnilari  Adaisha Campise R Eulalio Reamy, BSN, RN, CM [phone]

## 2015-04-10 NOTE — Progress Notes (Addendum)
Tennis Shipoy [facility] rm 250- [facility] RN visit Patient seen lying in bed, alert, more interactive than at last visit. Mrs. Fudala at bedside. Of note the swelling to the left side of his face is much improved. Weeping edema continues to right arm. Patient was moved to the CCU over the weekend for treatment of low BP and oxygen saturations. He continues on IV abt. Foley draining clear yellow urine. Tax inspectorWriter assisted staff Tech with changing patient of a loose, green colored bowel movement during visit. Patient denied pain, reports he is "feeling better" and asking for ice chips. He has been seen by Speech Therapy and is cleared for advancement of his diet as he was NPO. Per chart review and Staff RN Alana, [facility] is now [facility] at family request. Will update Hospice team. Updated information faxed to triage. Thank you. Dayna BarkerKaren Robertson, RN, BSN,  [facility]  Hospice Liaison  [phone])  Update: Writer spoke with patient's daughter Clydie BraunKaren in the patient's room, she has been made aware that Hospice will not cover the cost of [facility]. Family continues to want transport. CM Ermalene SearingNann Greene aware.

## 2015-04-10 NOTE — Progress Notes (Signed)
Report called to Delicia, [facility]. Pt is stable and ready for transfer. Assessment unchanged from this morning. 1815 scheduled dose of Meropenem will be given prior to transport via [facility]. Elana AlmNan, Futures traderCare Manager, has called [facility] and they are en route.

## 2015-04-10 NOTE — Consult Note (Signed)
General Aspect Primary Cardiologist: New to [facility]: Lambert Rhonda W ___________  81 year old male with history of PAF not on long term anticoagulation 2/2 hemoptysis and anemia, HTN, HLD, anemia, urinary rentention, failure to thrive, recent cellulitis and sepsis of the right upper extremity, debility, and dementia who was recently admitted to [facility] 1/27-2/4 for RUE cellulitis, bacteremia and falls. He was discharged to [facility]. He returned to [facility] on 2/10 complaining of chest pain. Cardiology was consulted for further input.  __________  PMH: 1. PAF not on long term anticoagulation 2/2 hemoptysis and anemia 2. HTN 3. HLD 4. Anemia 5. Urinary rentention 6. Failure to thrive 7. Recent cellulitis and sepsis of the right upper extremity 8. Debility 9. Dementia   Present Illness 81 year old male with the above problem list who presented to [facility] from the Hospice home after his recent admissions in both of January with sepsis and failure to thrive and March for malnutrition and was found to be in new onset a-fib. During his hospitalization he March 2015 he was found to be in new onset a-fib. He was found to be septic. He did devlop some chest pain that was felt to be GI in nature. Cardiac enzymes were negative, EKG without acute changes. EGD showed esophagitis. His new onset a-fib was felt to be 2/2 his sepsis and anemia. He did convert back into tNSR during his admission. He was not placed on long term anticoagulation given his history of hemoptysis and anemia requiring transfusion of 1 unit of pRBC. Echo during prior admission showed normal LV function, mildly dialted LA at 4.2 cm. He was discharged on Cardizem CD 120 mg daily and Toprol XL 50 mg daily. He was having some LEE. His Lasix was during the admission given worsening renal function. He was not discharged on Lasix.   He presented to [facility] on 4/28 from the [place] with left sided neck and face tenderness x 1-2 days that has gotten worse over the past 24  hours prior to admission. Afebrile. No chills. He also noted some diarrhea. No nausea or vomiting. No abdominal pain. He was noted to be in a-fib with RVR with heart rate in the 1-teens. Labs showed a WBC count of 21.9. Hgb 11.7-->8.8. SCr 1.76-->1.65. CT neck showed acute left parotiditis without evidence of sialolithiasis or abscess. CXR showed improved interstitial markings comapred to prior study. He was found to be hypotensive 90s/50s in the ED. This did improve on the floor to the 100s/60s with low dose fluids at 50 mL/hr. Throughout the day on 4/28 he received diltiazem pushes and digoxin without intermittent improvement in heart rate. He ultimately was placed on amiodarone gtt with conversion to NSR.   Physical Exam:  GEN frail appearing   HEENT PERRL, hearing intact to voice, dry oral mucosa   NECK supple  trachea midline  no JVD   RESP normal resp effort  crackles   CARD Regular rate and rhythm  No murmur   EXTR negative edema   SKIN normal to palpation   NEURO cranial nerves intact, motor/sensory function intact   PSYCH lethargic   Review of Systems:  ROS Pt not able to provide ROS  lethargy   Medications/Allergies Reviewed Medications/Allergies reviewed   Family & Social History:  Family and Social History:  Family History Stroke   Social History negative ETOH, negative Illicit drugs   + Tobacco Prior (greater than 1 year)   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  paf:    gerd:    high cholesterol:    HTN:    hernia:    surgery:   Home Medications: Medication Instructions Status  simvastatin 80 mg oral tablet 0.5 tab(s) orally once a day Active  omeprazole 20 mg oral delayed release capsule 1 cap(s) orally once a day Active  tamsulosin 0.4 mg oral capsule 2 cap(s) orally once a day Active  Vitamin D3 1000 intl units oral capsule 1 cap(s) orally 2 times a day Active  Theragran-M Therapeutic Multiple Vitamins with Minerals oral tablet 1  tab(s) orally once a day Active  loperamide 2 mg oral capsule 1 cap(s) orally 4 times a day, As Needed - for Diarrhea Active  mirtazapine 15 mg oral tablet 0.5 tab(s) orally once a day (at bedtime) Active  melatonin 3 mg oral tablet 1 tab(s) orally once a day (at bedtime) Active  traZODone 50 mg oral tablet 1 tab(s) orally once a day (at bedtime) Active  Vitamin D2 50,000 intl units oral capsule 1 cap(s) orally once a week Active  clotrimazole topical 1% topical cream Apply topically to affected area 2 times a day Active  Mucinex 600 mg oral tablet, extended release 1 tab(s) orally every 12 hours, As Needed for cough/congestion. Active  bisacodyl 10 mg rectal suppository 1 suppository(ies) rectal once a day, As Needed - for Constipation Active  zinc oxide topical 20% topical ointment Apply topically to affected area once a day, As Needed for reddened areas. Active  HYDROmorphone 2 mg oral tablet 0.5-1 tab(s) orally every 4 hours, As Needed - for Pain Active   Lab Results:  Hepatic:  29-Apr-16 04:54   Albumin, Serum  1.2 (3.5-5.0 NOTE: New reference range  02/14/15)  Routine Chem:  28-Apr-16 00:26   Creatinine (comp)  1.76 (0.61-1.24 NOTE: New Reference Range  02/14/15)  29-Apr-16 04:54   Glucose, Serum 80 (65-99 NOTE: New Reference Range  02/14/15)  BUN  23 (6-20 NOTE: New Reference Range  02/14/15)  Creatinine (comp)  1.65 (0.61-1.24 NOTE: New Reference Range  02/14/15)  Sodium, Serum  130 (135-145 NOTE: New Reference Range  02/14/15)  Potassium, Serum 4.3 (3.5-5.1 NOTE: New Reference Range  02/14/15)  Chloride, Serum 104 (101-111 NOTE: New Reference Range  02/14/15)  CO2, Serum  18 (22-32 NOTE: New Reference Range  02/14/15)  Calcium (Total), Serum  6.7 (8.9-10.3 NOTE: New Reference Range  02/14/15)  Anion Gap 8  eGFR (African American)  44  eGFR (Non-African American)  38 (eGFR values <33m/min/1.73 m2 may be an indication of chronic kidney disease  (CKD). Calculated eGFR is useful in patients with stable renal function. The eGFR calculation will not be reliable in acutely ill patients when serum creatinine is changing rapidly. It is not useful in patients on dialysis. The eGFR calculation may not be applicable to patients at the low and high extremes of body sizes, pregnant women, and vegetarians.)  Result Comment - CALCIUM CALLED TO RACHEL  - MCRAE @0648  04-07-15 BY AJO  - RESULTS VERIFIED BY REPEAT TESTING  - NOTIFIED OF CRITICAL / READ-BACK PERFORMED  - K-HEMOLYSIS AT THIS LEVEL MAY AFFECT THE RESULT  Result(s) reported on 07 Apr 2015 at 06:04AM.  Routine UA:  28-Apr-16 05:45   Color (UA) Amber  Clarity (UA) Cloudy  Glucose (UA) Negative  Bilirubin (UA) Negative  Ketones (UA) Negative  Specific Gravity (UA) 1.029  Blood (UA) 2+  pH (UA) 5.0  Protein (UA) 30 mg/dL  Nitrite (UA) Negative  Leukocyte Esterase (  UA) 3+ (Result(s) reported on 06 Apr 2015 at 06:18AM.)  RBC (UA) 6-30  WBC (UA) TNTC  Bacteria (UA) MANY  Epithelial Cells (UA) 0-5  WBC Clump (UA) PRESENT  Hyaline Cast (UA) PRESENT  Amorphous Crystal (UA) PRESENT (Result(s) reported on 06 Apr 2015 at 06:18AM.)  Routine Hem:  28-Apr-16 00:26   WBC (CBC)  21.9  Hemoglobin (CBC)  11.7  Hematocrit (CBC)  35.7  29-Apr-16 04:54   WBC (CBC) -  RBC (CBC) -  Hemoglobin (CBC) -  Hematocrit (CBC) -  Platelet Count (CBC) -  MCV -  MCH -  MCHC -  RDW -  Neutrophil % -  Lymphocyte % -  Monocyte % -  Eosinophil % -  Basophil % -  Neutrophil # -  Lymphocyte # -  Monocyte # -  Eosinophil # -  Basophil # - (Result(s) reported on 07 Apr 2015 at 06:04AM.)  Bands -  Segmented Neutrophils -  Lymphocytes -  Variant Lymphocytes -  Monocytes -  Eosinophil -  Basophil -  Metamyelocyte -  Myelocyte -  Promyelocyte -  Blast-Like -  Other Cells -  NRBC -  Diff Comment 1 -  Diff Comment 2 -  Diff Comment 3 -  Diff Comment 4 -  Diff Comment 5 -  Diff Comment 6  -  Diff Comment 7 -  Diff Comment 8 -  Diff Comment 9 -  Diff Comment 10 - (Result(s) reported on 07 Apr 2015 at 06:04AM.)    08:03   WBC (CBC)  17.7  Hemoglobin (CBC)  8.8  Hematocrit (CBC)  26.8   EKG:  EKG Interp. by me   Interpretation a-fib with RVR, 119 bpm, low voltage, PVCs, no significant st/t changes    Morphine: Other  Rocephin: Other  Vital Signs/Nurse's Notes: **Vital Signs.:   29-Apr-16 05:21  Vital Signs Type Routine  Temperature Temperature (F) 98  Celsius 36.6  Temperature Source oral  Pulse Pulse 101  Pulse source if not from Vital Sign Device per cardiac monitor  Respirations Respirations 18  Systolic BP Systolic BP 903  Diastolic BP (mmHg) Diastolic BP (mmHg) 65  Mean BP 77  Pulse Ox % Pulse Ox % 95  Pulse Ox Activity Level  At rest  Oxygen Delivery Room Air/ 21 %    Impression 81 year old male with history of PAF not on long term anticoagulation 2/2 hemoptysis and anemia, HTN, HLD, anemia, urinary rentention, failure to thrive, recent cellulitis and sepsis of the right upper extremity, debility, and dementia who was recently admitted to [facility] 1/27-2/4 for RUE cellulitis, bacteremia and falls. He was discharged to [facility]. He returned to [facility] on 2/10 complaining of chest pain. Cardiology was consulted for further input.  1. A-fib with RVR: -In the setting of cellulitis of his left face and neck, UTI, and sepsis -Initially with difficult rate control on 4/28 with digoxin and caridzem, improved with amiodarone gtt -Change amiodarone gtt to po amiodarone 400 mg bid x 1 week, 200 mg bid x 1 week, 200 mg daily -Hypotensive in the setting of sepsis precluding usage of diltiazem and metoprolol -Given worsening anemia (likely dilution), history of hemoptysis, failure to thrive, and baseline Hospice Home care would not long term anticoagulate at this time  2. Sepsis: -In the setting of left face and neck cellulitis/UTI -ABX per IM -IV fluids per IM -Poor  po intake  3. HLD: -Continue statin  4. Failure to thrive: -Multiple admission from  Hospice home -Wife continues to want aggressive care -Overall poor prognosis   Electronic Signatures for Addendum Section:  Kathlyn Sacramento (MD) (Signed Addendum 29-Apr-16 22:00)  The patient was seen and examined. Overall, he is not a good historian with known history of failure to thrive and being under hospice. He presented with left upper extremity cellulitis with sepsis. He had atrial fibrillation with rapid ventricular response which improved with amiodarone. It appears that he is having intermittent A. fib with periods of sinus rhythm with first degree AV block. Continue treatment with oral amiodarone and treat underlying condition. I don't think he is a good candidate for anticoagulation given ongoing anemia.   Electronic Signatures: Kathlyn Sacramento (MD)  (Signed 29-Apr-16 22:00)  Co-Signer: General Aspect/Present Illness, History and Physical Exam, Review of System, Family & Social History, Past Medical History, Home Medications, Labs, EKG , Allergies, Vital Signs/Nurse's Notes, Impression/Plan Dunn, Ryan M (PA-C)  (Signed 29-Apr-16 11:30)  Authored: General Aspect/Present Illness, History and Physical Exam, Review of System, Family & Social History, Past Medical History, Home Medications, Labs, EKG , Allergies, Vital Signs/Nurse's Notes, Impression/Plan   Last Updated: 29-Apr-16 22:00 by Kathlyn Sacramento (MD)

## 2015-04-10 NOTE — Progress Notes (Addendum)
Speech Language Pathology Treatment: Dysphagia  Patient Details Name: Marcus SpragueRoy H Purtee MRN: [id number] DOB: [birth year] Today's Date: 04/10/2015 Time:  -     Assessment / Plan / Recommendation Clinical Impression  Pt was reassessed with solids and thin liquids. Pt tolerated thin liquids well today without s/s of aspiration. Given solid foods, Pt needed extra time to masticate and propel boluses. Eventually had to have some of the solid bolus removed. Moderate oral residue with most solids. Vocal quality was wet on occasion. Pt was able to clear wet quality with cues to cough. Paged MD, rec puree with thin liquids (dysphagia 1) Strict aspiration precautions.   HPI Other Pertinent Information: wife present and supportive   Pertinent Vitals    SLP Plan    Order Dys 1 diet, f/u with toleration   Recommendations  Small sips, one at a time, fully upright for meals and meds. Meds in something thick. Pt does not like applesauce.                        Eather ColasJennifer A Russ Looper 04/10/2015, 1:58 PM

## 2015-04-10 NOTE — Care Management (Signed)
Cm informed that demographics had been faxed to [facility] x 2.  [facility] states they have not received referral.  Re faxed and confirmed receipt.  Estimated time of arrival will be 7:30p.  Primary nurse has called report.  Patient';s daughter Clydie BraunKaren asking about coverage for transport.    Reviewed previous discussion that the transport will not be covered and that hospice per Clydie BraunKaren will cover the hospital stay with understanding that the transport is not for medical necessity- it per family request.  Discussed again that wife was agreeable and stated.  Clydie BraunKaren says "that is all i needed to know- Mom agreed."  Clydie Braun(Karen was present in the room during this discussion)  Eber HongNann R Nickalos Petersen, BSN, RN, CM [phone]

## 2015-04-10 NOTE — Progress Notes (Signed)
Nutrition Follow-up  DOCUMENTATION CODES:   INTERVENTION: Meals and Snacks: Cater to patient preferences  Medical Nutrition Supplement: Ensure Enlive (each supplement provides 350kcal and 20 grams of protein) TID with meals  NUTRITION DIAGNOSIS:  Inadequate oral intake related to chronic illness as evidenced by NPO status, meal completion < 50%, energy intake < 75% for > or equal to 3 months, significant weight loss    GOAL: Energy Intake:Patient will meet greater than or equal to 90% of their needs   MONITOR:  PO intake, Supplement acceptance, Labs, Weight trends, Skin, Digestive Profile  REASON FOR ASSESSMENT:  Other (Comment) (Follow Up)    ASSESSMENT:   Typical Fluid/ Food Intake: pt with poor PO during admission; also NPO x 1 day  Labs:  Electrolyte and Renal Profile:    Recent Labs Lab 04/07/15 0454 04/08/15 0353 04/09/15 0539 04/10/15 0457  BUN 23* 22* 23*  --   CREATININE 1.65* 1.70* 1.65* 1.64*  NA 130* 130* 137  --   K 4.3 3.7 4.3  --    Protein Profile:  Recent Labs Lab 04/07/15 0454 04/09/15 0539  ALBUMIN 1.2* 1.1*     Meds: Atorvastatin, Remeron, NS 2@50  ml/ hr, Colace  UOP: Reviewed Digestive System: Last BM- 5/1  Weight Changes: Current weight represents weight gain; RD unsure of accuracy considering poor PO intake  Height:  Ht Readings from Last 1 Encounters:  04/07/15 5' 10.98" (1.803 m)    Weight:  Wt Readings from Last 1 Encounters:  04/07/15 186 lb 8.2 oz (84.6 kg)    Ideal Body Weight:    Wt Readings from Last 10 Encounters:  04/07/15 186 lb 8.2 oz (84.6 kg)    BMI:  Body mass index is 26.02 kg/(m^2).  Estimated Nutritional Needs:  Kcal:  1749-2098 kcal/ day (BEE: 1457 x 1.2 AF x 1.0-1.2 IF)- carried over from 4/28 RD assessment in SCM  Protein:  89-104 g Pro/ day (1.2-1.4 g pro/kg/ day)- carried over from 4/28 RD Assessment in SCM  Fluid:  1850-2220 ml/ day (25-30 ml/kg)- carried over from 4/28 RD  assessment in SCM  Skin:  Reviewed, no issues  Diet Order:  DIET - DYS 1 Room service appropriate?: Yes with Assist; Fluid consistency:: Thin  EDUCATION NEEDS:  No education needs identified at this time   Intake/Output Summary (Last 24 hours) at 04/10/15 1645 Last data filed at 04/10/15 1029  Gross per 24 hour  Intake 542.67 ml  Output    475 ml  Net  67.67 ml    Last BM:  5/1 Joeseph Amorracey L. Gaines, RDN [phone]  HIGH Care Level

## 2015-04-10 NOTE — Progress Notes (Signed)
Pt A&O, but confused at times.  Afib with PVCs and PACs on tele.  Pt received 1 unit blood.  Pt rested well off an on throughout shift.  No complaints of pain.  Pt states that he is hungry and wants water.  [facility].  No dyspnea at rest.   Cristela FeltHelen Iris Guidry, RN

## 2015-04-10 NOTE — Progress Notes (Addendum)
[facility] arrived to pick up patient. RN signed and printed Med Nec. Form. Pt assessment unchanged from earlier note and pt. Is stable and ready for transport. Report given to [place], EMT.

## 2015-04-10 NOTE — Discharge Summary (Signed)
STEEL KERNEY, is a 81 y.o. male  DOB 09-Aug-1930  MRN [id number].  Admission date:  04/06/2015  Admitting Physician  Oralia Manis, MD  Discharge Date:  04/10/2015   Primary MD  No primary care provider on file.  Recommendations for primary care physician for things to follow:   Transferred to  [facility]   Admission Diagnosis  CELLULITIS LEFT JAW SEPSIS    Discharge Diagnosis  CELLULITIS LEFT JAW SEPSIS   Right arm DVT Chronic Afib Dementia Malnutrition Acute on chronic Anemia Klebsiella UTI chronic renal failure   Active Problems:   Sepsis      Past Medical History  Diagnosis Date  . Hypertension     No past surgical history on file.     History of present illness and  Hospital Course:    This is an 81 year old gentleman who comes to the ED for complaint of left neck and face swelling and tenderness. He states that this has been going on for the last 1-2 days, got significantly worse over the past 24 hours prior to coming to the ED. He denies any fever. He has had some diarrhea. Denies abdominal pain. Denies any chills. He states that the redness just started on its own. He does not recall scratching or any lesions or anything that would cause an infection or cellulitis in that area. He is a patient at [facility]; however, he states that he wishes to be full code. In the ED, he was found to be tachycardic with a white count of 21.9 and hypotensive. He was given fluids and his blood pressure responded well. admitted for sepsis/cellulitis.receiving IV abx/IV fluids,  Hospital Course ;had episode of afib with RVR;monitored in ICU for 1 day,did not require IV amiodarone or Cardizem drips.seen by cardiology form [facility].transferred to  telemetry.vitals are stable since 2 days. Dysphagia/malnutrion;seen by speech/recommended NPO except meds in applesauce.Speech recommends alternate methods likePEg/NG  Tube; 4.Klebsiella UTI;already on vanco, and cefepime that can cover both UTi/celulitis 5.DVT;right arm;not a candidate for full anticoagulation due to dementia/h/o falls/anemia 6.htn; 7.hlp BPH 9.acute on chronic anemia;required one unit PRBC transfusion for hb 6,after transfusion hb improved to 10.      Discharge Condition:stable   Follow UP;being transferred to [facility]      Discharge Instructions  and  Discharge Medications       Medication List    ASK your doctor about these medications  bisacodyl 10 MG suppository  Commonly known as:  DULCOLAX  Place 10 mg rectally daily as needed for moderate constipation.     calcium-vitamin D 500-200 MG-UNIT per tablet  Commonly known as:  OSCAL WITH D  Take 2 tablets by mouth daily.     clotrimazole 1 % external solution  Commonly known as:  LOTRIMIN  Apply 1 application topically 2 (two) times daily. Apply topically to affected area     ergocalciferol 50000 UNITS capsule  Commonly known as:  VITAMIN D2  Take 50,000 Units by mouth once a week.     HYDROmorphone 2 MG tablet  Commonly known as:  DILAUDID  Take 2 mg by mouth every 4 (four) hours as needed for moderate pain or severe pain (give 0.5-1 tab(s) as needed for pain).     loperamide 2 MG capsule  Commonly known as:  IMODIUM  Take 2 mg by mouth 4 (four) times daily as needed for diarrhea or loose stools (1 cap as needed for diarrhea).     Melatonin 3 MG Tabs  Take 3 mg by mouth daily. At bedtime     mirtazapine 15 MG tablet  Commonly known as:  REMERON  Take 15 mg by mouth at bedtime. 0.5 tab at bedtime     MUCINEX PO  Take 600 mg by mouth every 12 (twelve) hours as needed. (1 tab) extended release- for cough and congestion     multivitamin-iron-minerals-folic acid Tabs  tablet  Take 1 tablet by mouth daily.     omeprazole 20 MG tablet  Commonly known as:  PRILOSEC OTC  Take 20 mg by mouth daily. Delayed release     simvastatin 80 MG tablet  Commonly known as:  ZOCOR  Take 80 mg by mouth daily. 0.5 tab daily     tamsulosin 0.4 MG Caps capsule  Commonly known as:  FLOMAX  Take 0.4 mg by mouth daily. (2 cap daily)     traZODone 50 MG tablet  Commonly known as:  DESYREL  Take 50 mg by mouth at bedtime.     zinc oxide 20 % ointment  Apply 1 application topically as needed for irritation. Apply topically to affected area once a day--- to redden area          Diet;npo except med's(crush med's in puree)   Consults obtained ;palliative care   Major procedures and Radiology Reports - PLEASE review detailed and final reports for all details, in brief -     Ct Soft Tissue Neck W Contrast  04/06/2015   CLINICAL DATA:  81 year old male with right neck swelling  EXAM: CT NECK WITH CONTRAST  TECHNIQUE: Multidetector CT imaging of the neck was performed using the standard protocol following the bolus administration of intravenous contrast.  CONTRAST:  60 mL Omnipaque 300 administered intravenously  COMPARISON:  Prior head CT 02/23/2015  FINDINGS: Pharynx and larynx: Mild asymmetry of the nasopharynx with collapse of the left fossa of Rosenmller. There is no definite enhancing soft tissue mass in the asymmetry is of uncertain clinical significance. Nonspecific dystrophic calcification at the left base of the tongue measuring 7.6 mm. Normal epiglottis, aryepiglottic folds and larynx.  Salivary glands: Diffuse edema and swelling of the left parotid gland with inflammatory stranding in the adjacent superficial subcutaneous fat. No significant ductal dilatation. No style low thigh assist. The right parotid gland and submandibular glands are within normal limits. No focal fluid collection to suggest abscess. Inflammatory stranding extends posteriorly of around the  neck and inferiorly along the surface of the sternocleidomastoid muscle.  Thyroid: 11 mm peripherally calcified nodule in the left thyroid gland. No specific imaging followup is required for lesions less than 15 mm.  Lymph nodes: No suspicious lymphadenopathy.  Vascular: Bilateral proximal internal carotid artery calcification greater on the right than the left. There may be a significant stenosis of the proximal right internal carotid artery. Jugular veins remain patent.  Limited intracranial: Within normal limits  Visualized orbits: Within normal limits.  Mastoids and visualized paranasal sinuses: Normally aerated.  Skeleton: No acute fracture or aggressive appearing lytic or blastic osseous lesion. Multilevel cervical spondylosis with anterior osteophyte formation.  Upper chest: Bilateral layering pleural effusions. Respiratory motion. Nonspecific patchy airspace opacity in the anterior aspect of the left upper lobe.  IMPRESSION: 1. Acute left parotiditis without evidence of sialolithiasis or abscess. 2. Atherosclerotic vascular calcifications including suspected hemodynamically significant stenosis of the right internal carotid artery. Recommend further evaluation with non emergent duplex ultrasound of the carotid arteries. 3. Nonspecific dystrophic calcification at the left tongue base may represent calcified lymphoid tissue. 4. Bilateral layering pleural effusions. 5. Nonspecific patchy airspace opacity in the anterior left upper lobe may reflect pneumonia or atelectasis.   Electronically Signed   By: Malachy Moan M.D.   On: 04/06/2015 02:06   Dg Chest Port 1 View  04/07/2015   CLINICAL DATA:  Suspect pneumonia, history of same.  EXAM: PORTABLE CHEST - 1 VIEW  COMPARISON:  Portable chest x-ray February nineteenth 2016  FINDINGS: The lungs are mildly hypoinflated. The interstitial markings are improved as compared to the previous study. The silhouette of a skin fold is noted on the left. There is no  alveolar infiltrate. There is no pleural effusion. The cardiac silhouette is top-normal in size. The pulmonary vascularity is normal. The trachea is midline. The bony thorax is unremarkable.  IMPRESSION: Mild hypoinflation. There is no pneumonia nor other acute cardiopulmonary abnormality.   Electronically Signed   By: David  [place] M.D.   On: 04/07/2015 07:31   US Doppler Up Extr Right ([facility] Hx)  04/08/2015   CLINICAL DATA:  81 year old male with a history of right upper extremity edema.  EXAM: RIGHT UPPER EXTREMITY VENOUS DOPPLER ULTRASOUND  TECHNIQUE: Gray-scale sonography with graded compression, as well as color Doppler and duplex ultrasound were performed to evaluate the upper extremity deep venous system from the level of the subclavian vein and including the jugular, axillary, basilic, radial, ulnar and upper cephalic vein. Spectral Doppler was utilized to evaluate flow at rest and with distal augmentation maneuvers.  COMPARISON:  01/19/2015  FINDINGS: Contralateral Subclavian Vein: Respiratory phasicity is normal and symmetric with the symptomatic side. No evidence of thrombus. Normal compressibility.  Internal Jugular Vein: No evidence of thrombus. Normal compressibility, respiratory phasicity and response to augmentation.  Subclavian Vein: No evidence of thrombus. Normal compressibility, respiratory phasicity and response to augmentation.  Axillary Vein: Occlusive thrombus of the right axillary vein.  Cephalic Vein: No evidence of thrombus. Normal compressibility, respiratory phasicity and response to augmentation.  Basilic Vein: No evidence of thrombus. Normal compressibility, respiratory phasicity and response to augmentation.  Brachial Veins: Occlusive thrombus of right brachial vein extending into the axillary vein. There is a paired brachial vein which remains patent.  Radial Veins: No evidence of thrombus. Normal compressibility, respiratory phasicity and response to augmentation.  Ulnar Veins:  No evidence of thrombus. Normal compressibility, respiratory phasicity and response to augmentation.  Other Findings:  Upper extremity edema.  IMPRESSION:  Sonographic survey right upper extremity is positive for occlusive DVT involving the right axillary vein and 1 of the 2 paired brachial venous systems.  Signed,  Yvone NeuJaime S. Loreta AveWagner, DO  [facility]  [facility]   Electronically Signed   By: Gilmer MorJaime  Wagner D.O.   On: 04/08/2015 17:58    Micro Results    Recent Results (from the past 240 hour(s))  Culture, blood (single)     Status: None (Preliminary result)   Collection Time: 04/06/15  2:30 AM  Result Value Ref Range Status   Micro Text Report   Preliminary       COMMENT                   NO GROWTH IN 48 HOURS   ANTIBIOTIC                                                      Culture, blood (single)     Status: None (Preliminary result)   Collection Time: 04/06/15  2:49 AM  Result Value Ref Range Status   Micro Text Report   Preliminary       COMMENT                   NO GROWTH IN 48 HOURS   ANTIBIOTIC                                                      Urine culture     Status: None   Collection Time: 04/08/15  3:16 AM  Result Value Ref Range Status   Micro Text Report   Final       SOURCE: INDWELLING CATHETER    ORGANISM 1                >100,000 CFU/ML KLEBSIELLA PNEUMONIAE SSP PNEUMONI   ANTIBIOTIC                    ORG#1     AMPICILLIN                    R         CEFAZOLIN                     S         CEFOXITIN                     S         CEFTRIAXONE                   S         CIPROFLOXACIN                 S         GENTAMICIN                    S         IMIPENEM                      S  LEVOFLOXACIN                  S         NITROFURANTOIN                I         TRIMETHOPRIM/SULFAMETHOXAZOLE S                Today   Subjective:   Anacleto Batterman today has no headache,no chest abdominal pain,no new  weakness tingling or numbness, feels much better wants to go home today  Objective:   Blood pressure 99/47, pulse 93, temperature 97.5 F (36.4 C), temperature source Oral, resp. rate 18, height 5' 10.98" (1.803 m), weight 84.6 kg (186 lb 8.2 oz), SpO2 99 %.   Intake/Output Summary (Last 24 hours) at 04/10/15 1042 Last data filed at 04/10/15 0258  Gross per 24 hour  Intake 542.67 ml  Output    575 ml  Net -32.33 ml    Exam Awake Alert,No new F.N deficits, Normal affect Bricelyn.AT,PERRAL Supple Neck,No JVD, No cervical lymphadenopathy appriciated.  Symmetrical Chest wall movement, Good air movement bilaterally, CTAB RRR,No Gallops,Rubs or new Murmurs, No Parasternal Heave +ve B.Sounds, Abd Soft, Non tender, No organomegaly appriciated, No rebound -guarding or rigidity. No Cyanosis, Clubbing  Has right arm edema present. Data Review   CBC w Diff: Lab Results  Component Value Date   WBC 16.3* 04/09/2015   WBC 17.3* 04/08/2015   HGB 10.3* 04/10/2015   HGB 9.5* 04/08/2015   HCT 30.9* 04/10/2015   HCT 28.6* 04/08/2015   PLT 134* 04/09/2015   PLT 239 04/08/2015   LYMPHOPCT 10.2 04/08/2015   LYMPHOPCT 17 08/26/2011   MONOPCT 2.0 04/08/2015   MONOPCT 5 08/26/2011   EOSPCT 0.6 04/08/2015   EOSPCT 3 08/26/2011   BASOPCT 0.5 04/08/2015   BASOPCT 0 08/26/2011    CMP: Lab Results  Component Value Date   NA 137 04/09/2015   NA 130* 04/08/2015   K 4.3 04/09/2015   K 3.7 04/08/2015   CL 115* 04/09/2015   CL 107 04/08/2015   CO2 13* 04/09/2015   CO2 17* 04/08/2015   BUN 23* 04/09/2015   BUN 22* 04/08/2015   CREATININE 1.64* 04/10/2015   CREATININE 1.70* 04/08/2015   PROT 4.3* 04/09/2015   PROT 4.8* 01/08/2015   ALBUMIN 1.1* 04/09/2015   ALBUMIN 1.2* 04/07/2015   BILITOT 0.7 04/09/2015   ALKPHOS 130* 04/09/2015   ALKPHOS 148* 01/08/2015   AST 52* 04/09/2015   AST 57* 01/08/2015   ALT 25 04/09/2015   ALT 37 01/08/2015  .   Total Time in preparing paper work, data  evaluation and todays exam - 35 minutes  Linah Klapper M.D on 04/10/2015 at 10:42 AM  Accepting physician;Dr.Enilari.

## 2015-04-11 ENCOUNTER — Ambulatory Visit (HOSPITAL_COMMUNITY)
Admission: AD | Admit: 2015-04-11 | Discharge: 2015-04-11 | Disposition: A | Discharge status: Home / Self Care | Payer: Medicare Other | Source: Other Acute Inpatient Hospital | Attending: Internal Medicine | Admitting: Internal Medicine

## 2015-04-11 DIAGNOSIS — L0390 Cellulitis, unspecified: Secondary | ICD-10-CM | POA: Insufficient documentation

## 2015-04-11 DIAGNOSIS — A419 Sepsis, unspecified organism: Secondary | ICD-10-CM | POA: Diagnosis not present

## 2015-04-11 LAB — TYPE AND SCREEN
ABO/RH(D): O POS
ANTIBODY SCREEN: NEGATIVE
Unit division: 0

## 2015-04-12 LAB — COMPREHENSIVE METABOLIC PANEL
ALT: 25 U/L (ref 17–63)
AST: 52 U/L — ABNORMAL HIGH (ref 15–41)
Albumin: 1.1 g/dL — ABNORMAL LOW (ref 3.5–5.0)
Alkaline Phosphatase: 130 U/L — ABNORMAL HIGH (ref 38–126)
Anion gap: 9 (ref 5–15)
BUN: 23 mg/dL — ABNORMAL HIGH (ref 6–20)
CALCIUM: 6.4 mg/dL — AB (ref 8.9–10.3)
CO2: 13 mmol/L — ABNORMAL LOW (ref 22–32)
CREATININE: 1.65 mg/dL — AB (ref 0.61–1.24)
Chloride: 115 mmol/L — ABNORMAL HIGH (ref 101–111)
GFR, EST AFRICAN AMERICAN: 43 mL/min — AB (ref >60)
GFR, EST NON AFRICAN AMERICAN: 37 mL/min — AB (ref >60)
GLUCOSE: 76 mg/dL (ref 65–99)
Potassium: 4.3 mmol/L (ref 3.5–5.1)
Sodium: 137 mmol/L (ref 135–145)
TOTAL PROTEIN: 4.3 g/dL — AB (ref 6.5–8.1)
Total Bilirubin: 0.7 mg/dL (ref 0.3–1.2)

## 2015-04-12 LAB — PREPARE RBC (CROSSMATCH)

## 2015-05-10 ENCOUNTER — Ambulatory Visit: Payer: PRIVATE HEALTH INSURANCE | Admitting: Internal Medicine

## 2015-05-10 DEATH — deceased

## 2016-01-16 ENCOUNTER — Encounter: Payer: Self-pay | Admitting: Gastroenterology

## 2016-09-26 IMAGING — CR DG CHEST 1V PORT
1 series · 1 of 1 positions shown · non-contrast
Comparison: 01/18/2015

CLINICAL DATA: Shortness of breath

EXAM:
PORTABLE CHEST - 1 VIEW

[ap]
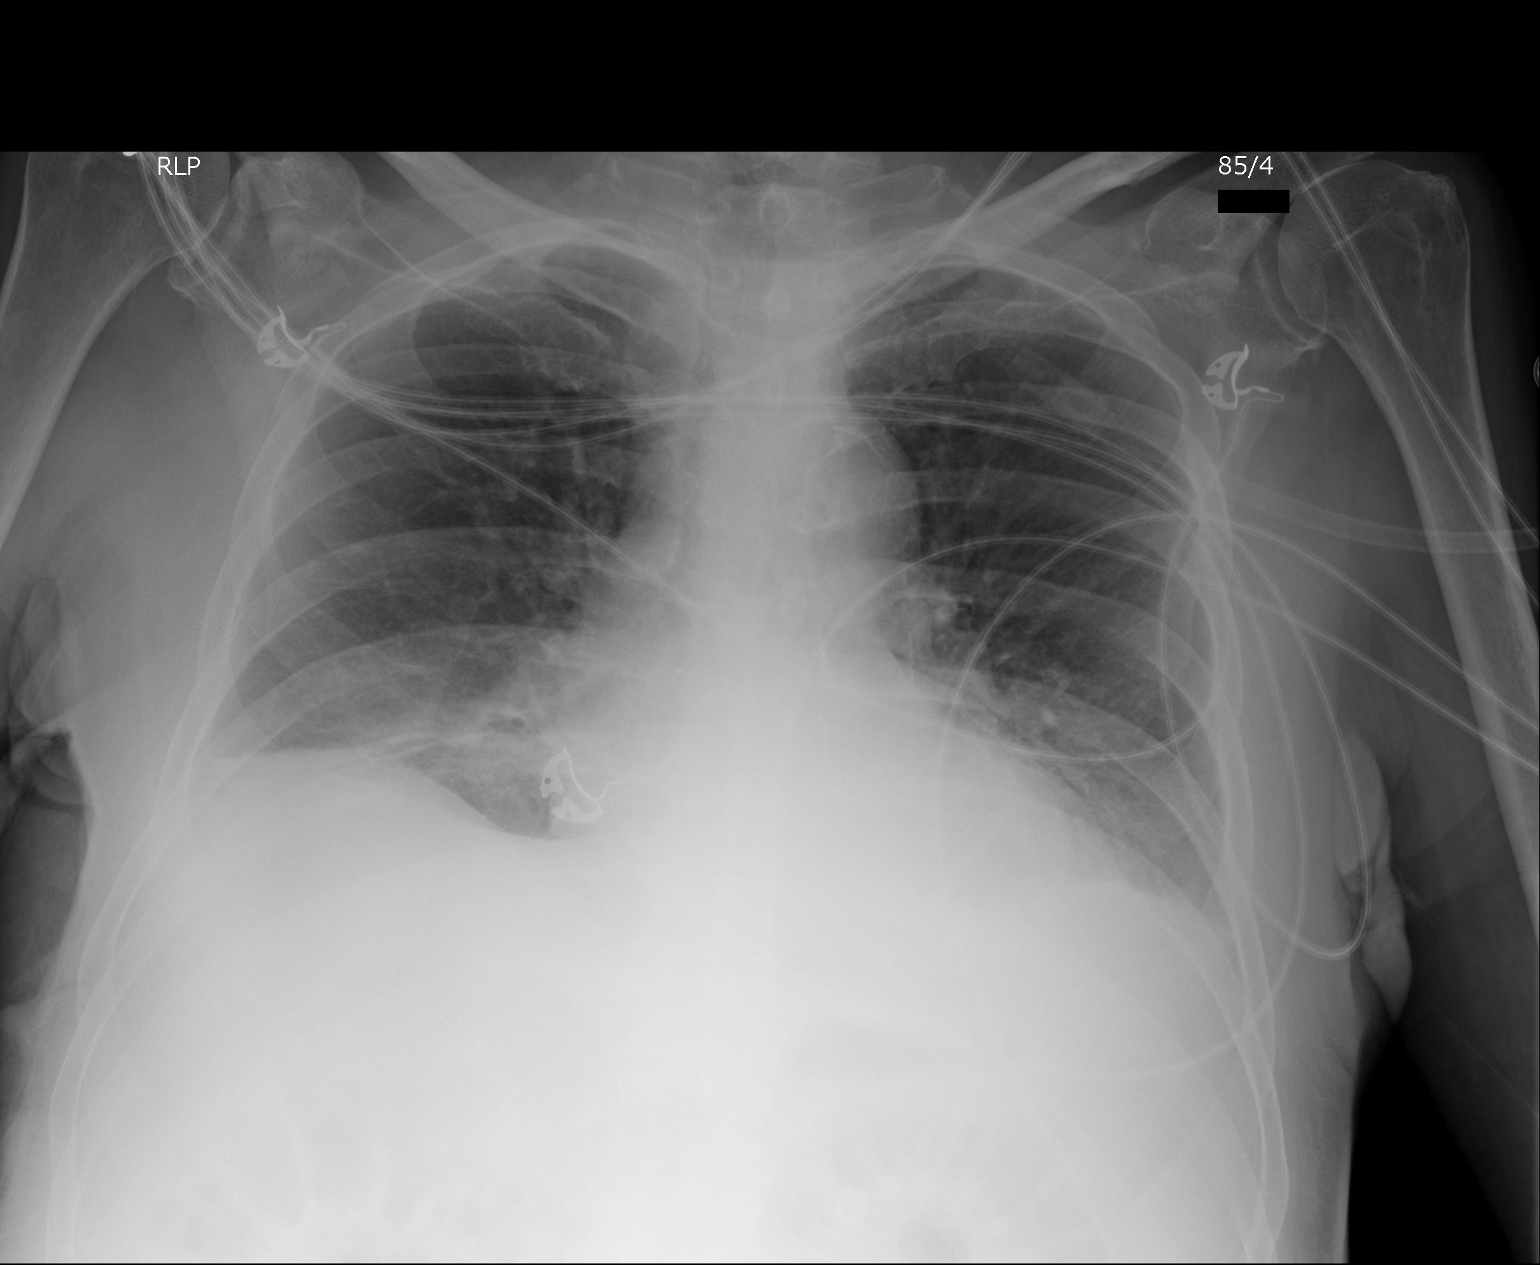

[1 of 1 positions shown; findings below may reference images not displayed]

FINDINGS: Cardiac shadow is stable. The lungs are well aerated bilaterally.
Bibasilar atelectatic changes and small right effusion are seen. The
overall appearance is stable from the prior study.
IMPRESSION: Bibasilar atelectatic changes with right effusion.

## 2016-10-06 IMAGING — MR MR ABDOMEN W/O CM
10 of 11 series · 37 of 48 positions shown · non-contrast
Comparison: CT abdomen pelvis dated 11/09/2014

CLINICAL DATA: Nausea, vomiting, weight loss since October 2014

EXAM:
MRI ABDOMEN WITHOUT CONTRAST
TECHNIQUE: Multiplanar multisequence MR imaging was performed without the
administration of intravenous contrast.

[Series 2: T2 · coronal · 8.0mm · 1.64mm/px · 4 of 23 slices shown]
[im 1/23]
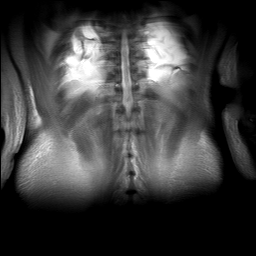
[im 8/23]
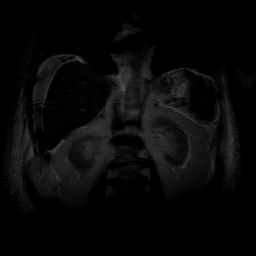
[im 15/23]
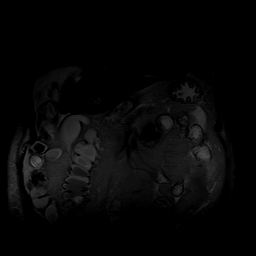
[im 23/23]
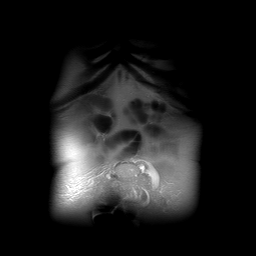

[Series 3: T2 fat-sat · axial · 8.0mm · 0.74mm/px · z∈[-36,+194]mm · 3 of 25 slices shown (1 of 2)]
[im 1/25]
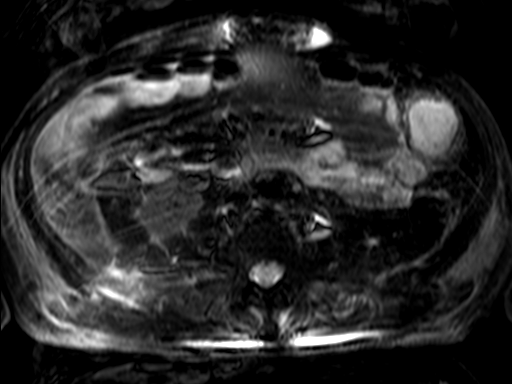
[im 13/25]
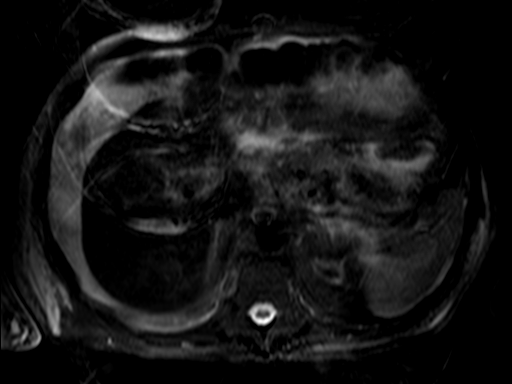
[im 25/25]
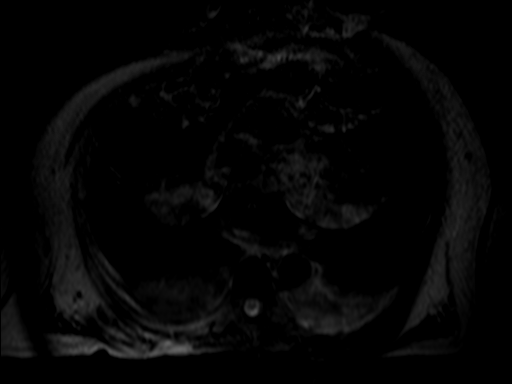

[Series 5: in phase · axial · 8.0mm · 0.74mm/px · z∈[-45,+204]mm · 3 of 25 slices shown]
[im 1/25]
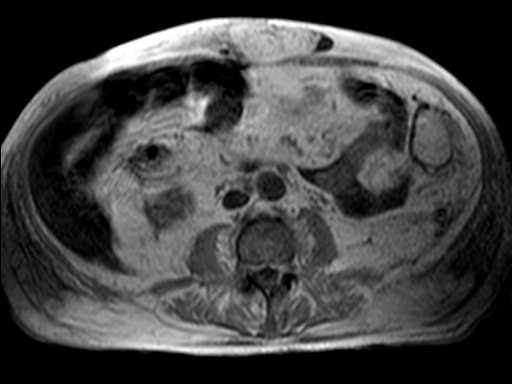
[im 13/25]
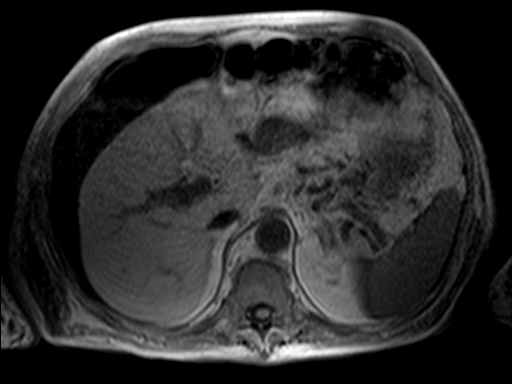
[im 25/25]
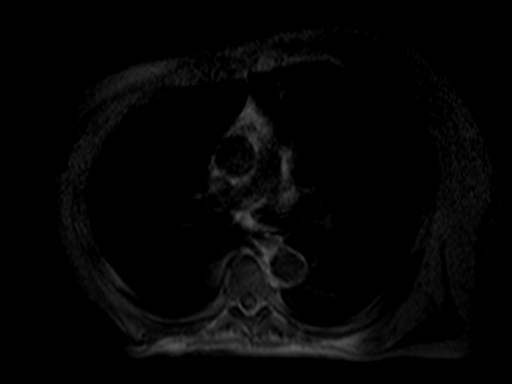

[Series 6: out of phase · axial · 8.0mm · 0.74mm/px · z∈[-45,+204]mm · 3 of 25 slices shown]
[im 1/25]
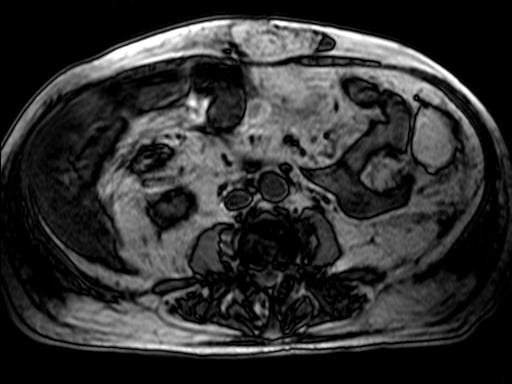
[im 13/25]
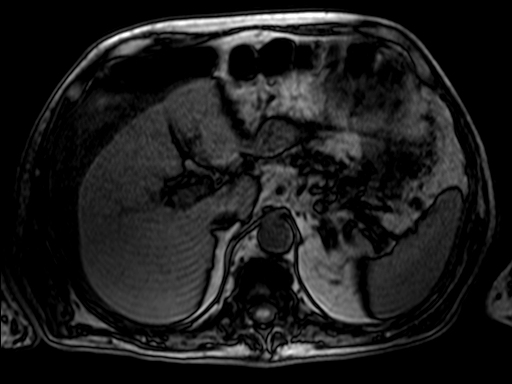
[im 25/25]
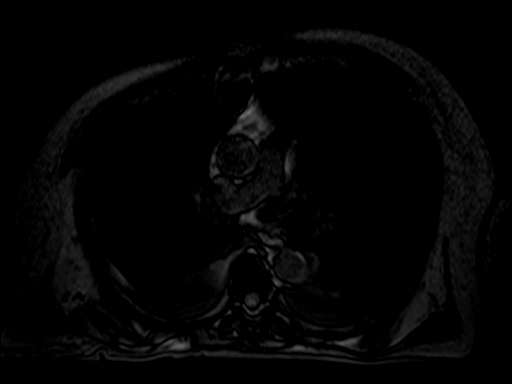

[Series 7: DWI · axial · 6.0mm · 2.97mm/px · z∈[-25,+183]mm · 8 of 90 slices shown]
[im 1/90]
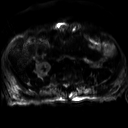
[im 18/90]
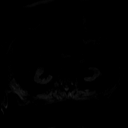
[im 27/90]
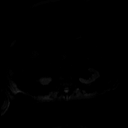
[im 36/90]
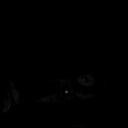
[im 54/90]
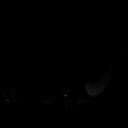
[im 63/90]
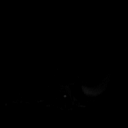
[im 72/90]
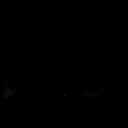
[im 90/90]
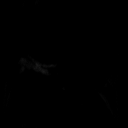

[Series 8: axial dwi_adc · axial · 6.0mm · 2.97mm/px · z∈[-25,+183]mm · 4 of 30 slices shown]
[im 1/30]
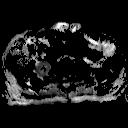
[im 10/30]
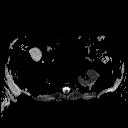
[im 20/30]
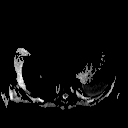
[im 30/30]
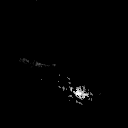

[Series 9: axial true fisp-- · axial · 4.0mm · 0.74mm/px · z∈[-19,+177]mm · 6 of 50 slices shown]
[im 1/50]
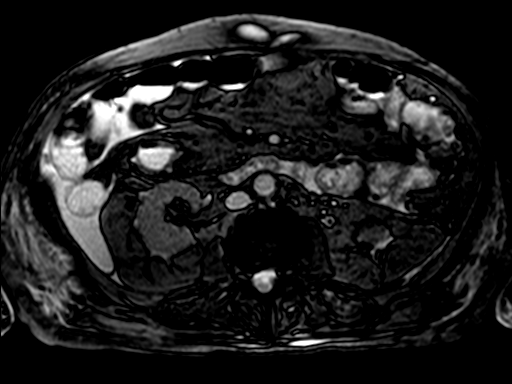
[im 10/50]
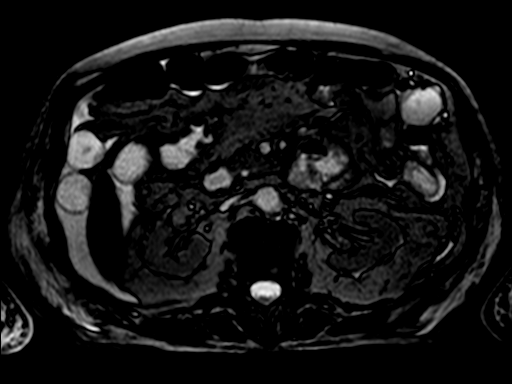
[im 20/50]
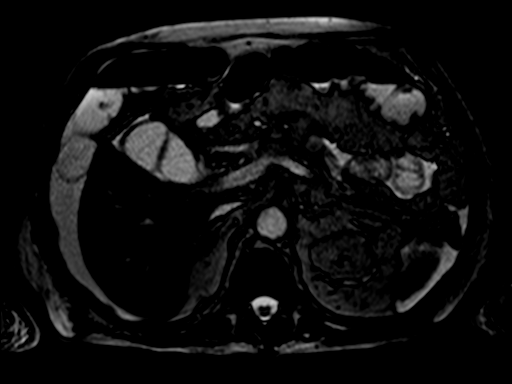
[im 30/50]
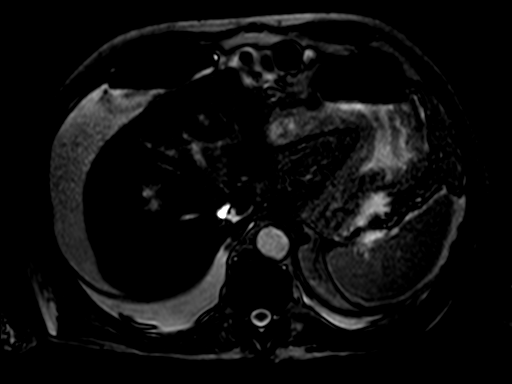
[im 40/50]
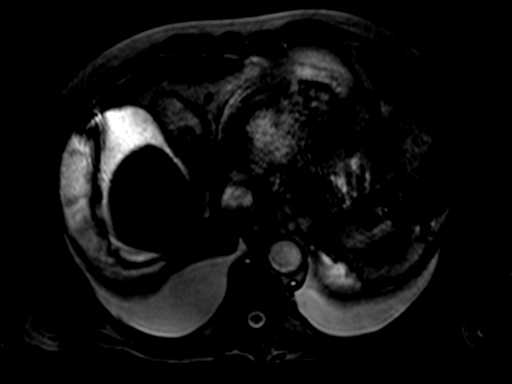
[im 50/50]
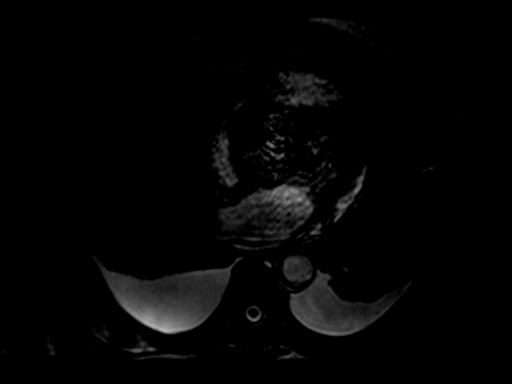

[Series 14: T2 fat-sat · axial · 8.0mm · 0.74mm/px · z∈[-36,+194]mm · 3 of 25 slices shown (2 of 2)]
[im 1/25]
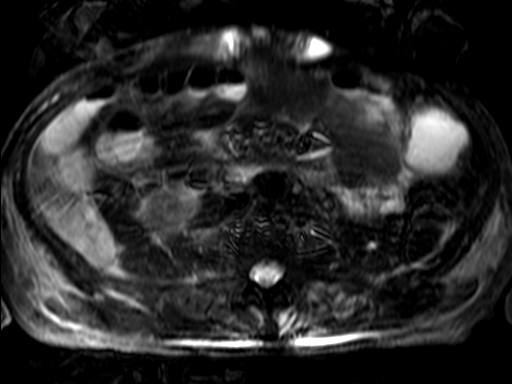
[im 13/25]
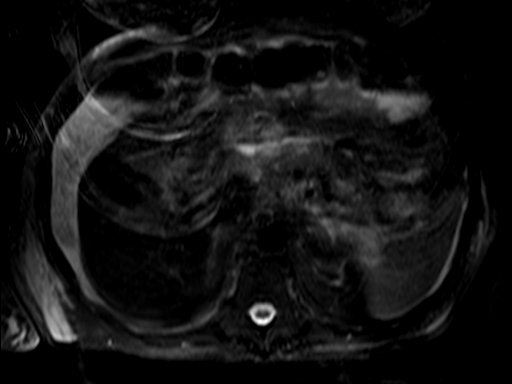
[im 25/25]
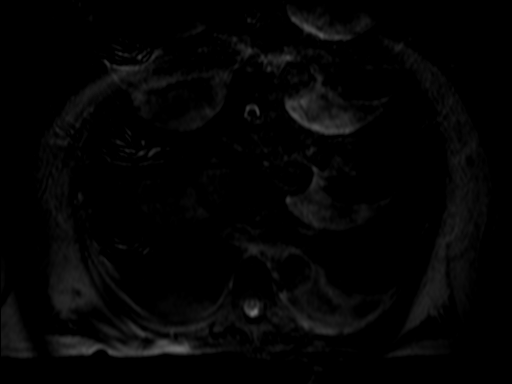

[Series 16: MRCP · axial · 0.79mm/px · 1 of 10 slices shown]
[im 1/10]
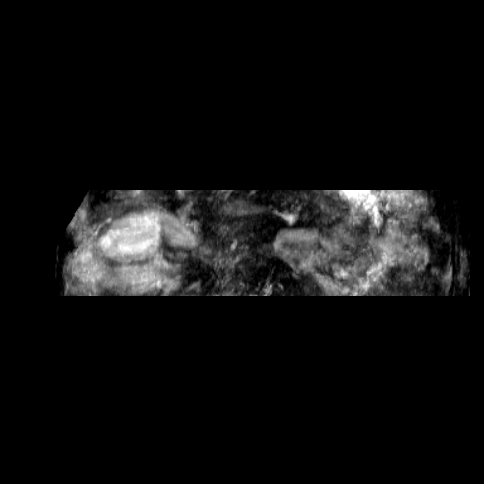

[Series 17: t2_haste_fs_axial_trig · axial · 8.0mm · 0.78mm/px · z∈[-19,+75]mm · 2 of 20 slices shown]
[im 1/20]
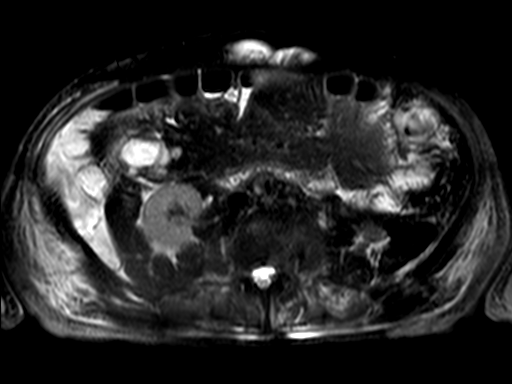
[im 10/20]
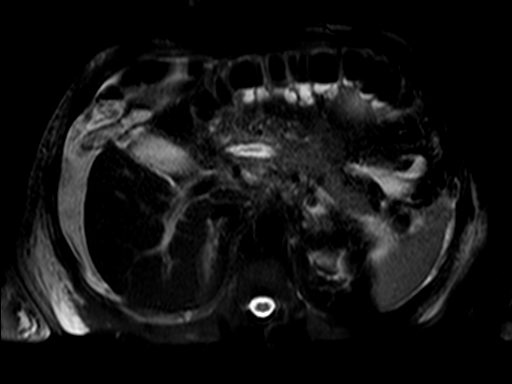

[37 of 48 positions shown; findings below may reference images not displayed]

FINDINGS: Severely motion degraded images.

Lower chest: Small to moderate bilateral pleural effusions, right
greater than left. Associated compressive atelectasis in the
bilateral lower lobes.

Hepatobiliary: Mild hepatic steatosis.

Layering gallstone, without associated inflammatory changes. No
intrahepatic or extrahepatic ductal dilatation.

Pancreas: Grossly unremarkable on opposed phase T1 and TRUE FISP
imaging. No pancreatic ductal dilatation. Other sequences are
severely motion degraded.

Spleen: Within normal limits.

Adrenals/Urinary Tract: Adrenal glands are grossly unremarkable.

Visualized kidneys are grossly unremarkable.  No hydronephrosis.

Stomach/Bowel: Stomach and visualized bowel are grossly
unremarkable.

Vascular/Lymphatic: No abdominal aortic aneurysm.

No suspicious abdominal lymphadenopathy.

Other: Moderate abdominal ascites.

Moderate fat containing periumbilical hernia (series 6/ image 24).

Musculoskeletal: Degenerative changes of the visualized
thoracolumbar spine.
IMPRESSION: Limited evaluation due to severely motion degraded images.

Visualized pancreas is grossly unremarkable. Consider dedicated
follow-up MRI abdomen with/without contrast in 3 months as an
outpatient if there is continued clinical concern (half dose MRI
contrast can be safely administered if GFR remains above 30).

Cholelithiasis, without associated inflammatory changes.

Moderate abdominal ascites.

Small to moderate bilateral pleural effusions, right greater than
left.

Mild hepatic steatosis.
